# Patient Record
Sex: Female | Born: 1950 | Race: White | Hispanic: No | Marital: Married | Smoking: Former smoker
Health system: Southern US, Community
[De-identification: ages and names within clinical notes are randomized; demographics above are authoritative.]

## PROBLEM LIST (undated history)

## (undated) DIAGNOSIS — I10 Essential (primary) hypertension: Secondary | ICD-10-CM

## (undated) DIAGNOSIS — IMO0001 Reserved for inherently not codable concepts without codable children: Secondary | ICD-10-CM

## (undated) DIAGNOSIS — K5792 Diverticulitis of intestine, part unspecified, without perforation or abscess without bleeding: Secondary | ICD-10-CM

## (undated) DIAGNOSIS — M1990 Unspecified osteoarthritis, unspecified site: Secondary | ICD-10-CM

## (undated) DIAGNOSIS — T4145XA Adverse effect of unspecified anesthetic, initial encounter: Secondary | ICD-10-CM

## (undated) DIAGNOSIS — K219 Gastro-esophageal reflux disease without esophagitis: Secondary | ICD-10-CM

## (undated) DIAGNOSIS — T8859XA Other complications of anesthesia, initial encounter: Secondary | ICD-10-CM

## (undated) DIAGNOSIS — E785 Hyperlipidemia, unspecified: Secondary | ICD-10-CM

## (undated) DIAGNOSIS — H348192 Central retinal vein occlusion, unspecified eye, stable: Secondary | ICD-10-CM

## (undated) DIAGNOSIS — F419 Anxiety disorder, unspecified: Secondary | ICD-10-CM

## (undated) DIAGNOSIS — F909 Attention-deficit hyperactivity disorder, unspecified type: Secondary | ICD-10-CM

## (undated) HISTORY — PX: CARDIAC CATHETERIZATION: SHX172

## (undated) HISTORY — PX: ABDOMINAL HYSTERECTOMY: SHX81

## (undated) HISTORY — PX: APPENDECTOMY: SHX54

## (undated) HISTORY — PX: BREAST SURGERY: SHX581

## (undated) HISTORY — PX: CHOLECYSTECTOMY: SHX55

## (undated) HISTORY — PX: BACK SURGERY: SHX140

---

## 2006-02-01 ENCOUNTER — Emergency Department (HOSPITAL_COMMUNITY): Admission: EM | Admit: 2006-02-01 | Discharge: 2006-02-01 | Payer: Self-pay | Admitting: *Deleted

## 2006-02-02 ENCOUNTER — Ambulatory Visit (HOSPITAL_COMMUNITY): Admission: RE | Admit: 2006-02-02 | Discharge: 2006-02-02 | Payer: Self-pay | Admitting: *Deleted

## 2006-02-03 ENCOUNTER — Emergency Department (HOSPITAL_COMMUNITY): Admission: EM | Admit: 2006-02-03 | Discharge: 2006-02-03 | Payer: Self-pay | Admitting: Family Medicine

## 2011-03-31 ENCOUNTER — Encounter: Payer: Self-pay | Admitting: *Deleted

## 2011-03-31 ENCOUNTER — Emergency Department (INDEPENDENT_AMBULATORY_CARE_PROVIDER_SITE_OTHER): Payer: PRIVATE HEALTH INSURANCE

## 2011-03-31 ENCOUNTER — Emergency Department (HOSPITAL_BASED_OUTPATIENT_CLINIC_OR_DEPARTMENT_OTHER)
Admission: EM | Admit: 2011-03-31 | Discharge: 2011-03-31 | Disposition: A | Discharge status: Home / Self Care | Payer: PRIVATE HEALTH INSURANCE | Attending: Emergency Medicine | Admitting: Emergency Medicine

## 2011-03-31 DIAGNOSIS — M25569 Pain in unspecified knee: Secondary | ICD-10-CM

## 2011-03-31 DIAGNOSIS — Z8739 Personal history of other diseases of the musculoskeletal system and connective tissue: Secondary | ICD-10-CM | POA: Insufficient documentation

## 2011-03-31 DIAGNOSIS — W19XXXA Unspecified fall, initial encounter: Secondary | ICD-10-CM

## 2011-03-31 DIAGNOSIS — I10 Essential (primary) hypertension: Secondary | ICD-10-CM | POA: Insufficient documentation

## 2011-03-31 DIAGNOSIS — W010XXA Fall on same level from slipping, tripping and stumbling without subsequent striking against object, initial encounter: Secondary | ICD-10-CM | POA: Insufficient documentation

## 2011-03-31 DIAGNOSIS — S8390XA Sprain of unspecified site of unspecified knee, initial encounter: Secondary | ICD-10-CM

## 2011-03-31 DIAGNOSIS — S8000XA Contusion of unspecified knee, initial encounter: Secondary | ICD-10-CM | POA: Insufficient documentation

## 2011-03-31 DIAGNOSIS — IMO0002 Reserved for concepts with insufficient information to code with codable children: Secondary | ICD-10-CM | POA: Insufficient documentation

## 2011-03-31 HISTORY — DX: Unspecified osteoarthritis, unspecified site: M19.90

## 2011-03-31 HISTORY — DX: Anxiety disorder, unspecified: F41.9

## 2011-03-31 HISTORY — DX: Essential (primary) hypertension: I10

## 2011-03-31 HISTORY — DX: Attention-deficit hyperactivity disorder, unspecified type: F90.9

## 2011-03-31 MED ORDER — OXYCODONE-ACETAMINOPHEN 5-325 MG PO TABS: 2.0000 | ORAL_TABLET | ORAL | Status: AC | PRN

## 2011-03-31 NOTE — ED Provider Notes (Signed)
Medical screening examination/treatment/procedure(s) were performed by non-physician practitioner and as supervising physician I was immediately available for consultation/collaboration.   Aria Pickrell A. Patrica Duel, MD 03/31/11 1531

## 2011-03-31 NOTE — ED Notes (Signed)
Last week she slipped on a wet floor. Pain is not better. Bruising and swelling noted.

## 2011-03-31 NOTE — ED Provider Notes (Signed)
History     CSN: [id number] Arrival date & time: 03/31/2011  2:15 PM   Chief Complaint  Patient presents with  . Knee Injury     (Include location/radiation/quality/duration/timing/severity/associated sxs/prior treatment) HPI Comments: Pt states that she slipped on the floor and she thought it was getting better and until the swelling started again  Patient is a 60 y.o. female presenting with knee pain. The history is provided by the patient. No language interpreter was used.  Knee Pain This is a new problem. The current episode started in the past 7 days. The problem occurs intermittently. The problem has been gradually worsening. The symptoms are aggravated by bending. She has tried ice for the symptoms.     Past Medical History  Diagnosis Date  . Hypertension   . Arthritis   . Anxiety   . ADD (attention deficit disorder with hyperactivity)      Past Surgical History  Procedure Date  . Back surgery   . Abdominal hysterectomy   . Appendectomy   . Cholecystectomy     No family history on file.  History  Substance Use Topics  . Smoking status: Former Games developer  . Smokeless tobacco: Not on file  . Alcohol Use: Yes    OB History    Grav Para Term Preterm Abortions TAB SAB Ect Mult Living                  Review of Systems  Constitutional: Negative.   Respiratory: Negative.   Neurological: Negative.     Allergies  Darvocet; Morphine and related; and Percodan  Home Medications   Current Outpatient Rx  Name Route Sig Dispense Refill  . LIPITOR PO Oral Take by mouth.      Marland Kitchen LEXAPRO PO Oral Take by mouth.      Marland Kitchen FLUTICASONE PROPIONATE 50 MCG/ACT NA SUSP Nasal Place 2 sprays into the nose daily.      Marland Kitchen VYVANSE PO Oral Take by mouth.        Physical Exam    BP 149/82  Pulse 85  Temp(Src) 97.9 F (36.6 C) (Oral)  Resp 20  SpO2 100%  Physical Exam  Nursing note and vitals reviewed. Constitutional: She appears well-developed and well-nourished.    Cardiovascular: Normal rate and regular rhythm.   Pulmonary/Chest: Effort normal and breath sounds normal.  Musculoskeletal: Normal range of motion. She exhibits edema and tenderness.       Pt has generalized bruising and swelling noted tot he right knee  Neurological: She is alert.    ED Course  Procedures  No results found for this or any previous visit. Dg Knee Complete 4 Views Right  03/31/2011  *RADIOLOGY REPORT*  Clinical Data: Larey Seat.  Right knee pain.  RIGHT KNEE - COMPLETE 4+ VIEW  Comparison: None  Findings: The joint spaces are maintained.  Minimal degenerative changes.  No acute fracture or osteochondral lesion.  There is an old healed fibrous cortical lesion involving the tibial metaphysis. No joint effusion.  IMPRESSION: Minimal degenerative changes.  No acute bony findings or joint effusion.  Original Report Authenticated By: P. Loralie Champagne, M.D.      MDM No acute finding:pt is okay to follow up with ortho at home:pt put in immobilizer and crutches for comfort       Teressa Lower, NP 03/31/11 1521

## 2011-04-14 DIAGNOSIS — K5792 Diverticulitis of intestine, part unspecified, without perforation or abscess without bleeding: Secondary | ICD-10-CM

## 2011-04-14 HISTORY — DX: Diverticulitis of intestine, part unspecified, without perforation or abscess without bleeding: K57.92

## 2011-12-10 ENCOUNTER — Other Ambulatory Visit: Payer: Self-pay | Admitting: Orthopedic Surgery

## 2011-12-10 DIAGNOSIS — M25561 Pain in right knee: Secondary | ICD-10-CM

## 2011-12-13 ENCOUNTER — Ambulatory Visit
Admission: RE | Admit: 2011-12-13 | Discharge: 2011-12-13 | Disposition: A | Discharge status: Home / Self Care | Payer: PRIVATE HEALTH INSURANCE | Source: Ambulatory Visit | Attending: Orthopedic Surgery | Admitting: Orthopedic Surgery

## 2011-12-13 DIAGNOSIS — M25561 Pain in right knee: Secondary | ICD-10-CM

## 2011-12-14 ENCOUNTER — Other Ambulatory Visit: Payer: PRIVATE HEALTH INSURANCE

## 2011-12-30 ENCOUNTER — Encounter (HOSPITAL_COMMUNITY): Payer: Self-pay | Admitting: Pharmacy Technician

## 2012-01-01 ENCOUNTER — Encounter (HOSPITAL_COMMUNITY): Payer: Self-pay

## 2012-01-01 ENCOUNTER — Ambulatory Visit (HOSPITAL_COMMUNITY)
Admission: RE | Admit: 2012-01-01 | Discharge: 2012-01-01 | Disposition: A | Discharge status: Home / Self Care | Payer: 59 | Source: Ambulatory Visit | Attending: Orthopedic Surgery | Admitting: Orthopedic Surgery

## 2012-01-01 ENCOUNTER — Encounter (HOSPITAL_COMMUNITY)
Admission: RE | Admit: 2012-01-01 | Discharge: 2012-01-01 | Disposition: A | Discharge status: Home / Self Care | Payer: 59 | Source: Ambulatory Visit | Attending: Orthopedic Surgery | Admitting: Orthopedic Surgery

## 2012-01-01 DIAGNOSIS — Z01812 Encounter for preprocedural laboratory examination: Secondary | ICD-10-CM | POA: Insufficient documentation

## 2012-01-01 DIAGNOSIS — X58XXXA Exposure to other specified factors, initial encounter: Secondary | ICD-10-CM | POA: Insufficient documentation

## 2012-01-01 DIAGNOSIS — IMO0002 Reserved for concepts with insufficient information to code with codable children: Secondary | ICD-10-CM | POA: Insufficient documentation

## 2012-01-01 DIAGNOSIS — Z01818 Encounter for other preprocedural examination: Secondary | ICD-10-CM | POA: Insufficient documentation

## 2012-01-01 HISTORY — DX: Gastro-esophageal reflux disease without esophagitis: K21.9

## 2012-01-01 HISTORY — DX: Diverticulitis of intestine, part unspecified, without perforation or abscess without bleeding: K57.92

## 2012-01-01 LAB — ABO/RH: ABO/RH(D): O NEG

## 2012-01-01 LAB — BASIC METABOLIC PANEL
BUN: 19 mg/dL (ref 6–23)
CO2: 29 mEq/L (ref 19–32)
Chloride: 97 mEq/L (ref 96–112)
Creatinine, Ser: 0.65 mg/dL (ref 0.50–1.10)
Glucose, Bld: 83 mg/dL (ref 70–99)

## 2012-01-01 LAB — CBC
HCT: 40.8 % (ref 36.0–46.0)
MCH: 27.3 pg (ref 26.0–34.0)
MCV: 84.3 fL (ref 78.0–100.0)
RBC: 4.84 MIL/uL (ref 3.87–5.11)
WBC: 6.8 10*3/uL (ref 4.0–10.5)

## 2012-01-01 NOTE — Patient Instructions (Signed)
22 Bianca Ferguson  01/01/2012   Your procedure is scheduled on:  Wednesday 01/07/2012 at 400pm  Report to [facility] at 130 pm  Call this number if you have problems the morning of surgery: [phone]   Remember:   Do not eat food:After Midnight.  May have clear liquids:up to 6 Hours before arrival- MAY HAVE CLEAR LIQUIDS FROM MIDNIGHT UP UNTIL 10AM THEN NOTHING UNTIL AFTER SURGERY!  Clear liquids include soda, tea, black coffee, apple or grape juice, broth.  Take these medicines the morning of surgery with A SIP OF WATER: Protonix   Do not wear jewelry, make-up or nail polish.  Do not wear lotions, powders, or perfumes.   Do not shave 48 hours prior to surgery. Men may shave face and neck.  Do not bring valuables to the hospital.  Contacts, dentures or bridgework may not be worn into surgery.       Patients discharged the day of surgery will not be allowed to drive home.  Name and phone number of your driver: Sal Volker  Special Instructions: CHG Shower Use Special Wash: 1/2 bottle night before surgery and 1/2 bottle morning of surgery.   Please read over the following fact sheets that you were given: MRSA Information, Blood fact sheet, Incentive Spirometry sheet, Sleep apnea sheet                If you have any questions, please call me at [phone] [facility].Dowell Hoon,RN,BSN

## 2012-01-01 NOTE — Pre-Procedure Instructions (Signed)
From [facility] 06/20/2010,Stress test 12/09/2007, Last Office Note 12/08/2011on chart.

## 2012-01-01 NOTE — Pre-Procedure Instructions (Signed)
Reviewed with patient pre-op instructions using Teach back method. 

## 2012-01-06 ENCOUNTER — Other Ambulatory Visit: Payer: Self-pay | Admitting: Orthopedic Surgery

## 2012-01-06 NOTE — Progress Notes (Signed)
Preoperative surgical orders have been place into the Epic hospital system for [facility] on 01/06/2012, 11:09 PM  by Patrica Duel for surgery on 01/07/2012.  Preop Knee orders including IV Tylenol, and IV Decadron as long as there are no contraindications to the above medications.

## 2012-01-07 ENCOUNTER — Encounter (HOSPITAL_COMMUNITY): Payer: Self-pay | Admitting: Anesthesiology

## 2012-01-07 ENCOUNTER — Ambulatory Visit (HOSPITAL_COMMUNITY): Payer: PRIVATE HEALTH INSURANCE | Admitting: Anesthesiology

## 2012-01-07 ENCOUNTER — Encounter (HOSPITAL_COMMUNITY)
Admission: RE | Disposition: A | Discharge status: Home / Self Care | Payer: Self-pay | Source: Ambulatory Visit | Attending: Orthopedic Surgery

## 2012-01-07 ENCOUNTER — Encounter (HOSPITAL_COMMUNITY): Payer: Self-pay | Admitting: *Deleted

## 2012-01-07 ENCOUNTER — Ambulatory Visit (HOSPITAL_COMMUNITY)
Admission: RE | Admit: 2012-01-07 | Discharge: 2012-01-07 | Disposition: A | Discharge status: Home / Self Care | Payer: PRIVATE HEALTH INSURANCE | Source: Ambulatory Visit | Attending: Orthopedic Surgery | Admitting: Orthopedic Surgery

## 2012-01-07 DIAGNOSIS — S83249A Other tear of medial meniscus, current injury, unspecified knee, initial encounter: Secondary | ICD-10-CM | POA: Diagnosis present

## 2012-01-07 DIAGNOSIS — K219 Gastro-esophageal reflux disease without esophagitis: Secondary | ICD-10-CM | POA: Insufficient documentation

## 2012-01-07 DIAGNOSIS — X58XXXA Exposure to other specified factors, initial encounter: Secondary | ICD-10-CM | POA: Insufficient documentation

## 2012-01-07 DIAGNOSIS — Z79899 Other long term (current) drug therapy: Secondary | ICD-10-CM | POA: Insufficient documentation

## 2012-01-07 DIAGNOSIS — IMO0002 Reserved for concepts with insufficient information to code with codable children: Secondary | ICD-10-CM | POA: Insufficient documentation

## 2012-01-07 DIAGNOSIS — I10 Essential (primary) hypertension: Secondary | ICD-10-CM | POA: Insufficient documentation

## 2012-01-07 HISTORY — PX: KNEE ARTHROSCOPY: SHX127

## 2012-01-07 LAB — TYPE AND SCREEN
ABO/RH(D): O NEG
Antibody Screen: NEGATIVE

## 2012-01-07 SURGERY — ARTHROSCOPY, KNEE
Anesthesia: General | Site: Knee | Laterality: Right | Wound class: Clean

## 2012-01-07 MED ORDER — PROMETHAZINE HCL 25 MG/ML IJ SOLN: 6.2500 mg | INTRAMUSCULAR | Status: DC | PRN

## 2012-01-07 MED ORDER — DEXAMETHASONE SODIUM PHOSPHATE 10 MG/ML IJ SOLN
10.0000 mg | Freq: Once | INTRAMUSCULAR | Status: AC
  Administered 2012-01-07: 10 mg via INTRAVENOUS
  Filled 2012-01-07: qty 1

## 2012-01-07 MED ORDER — METHOCARBAMOL 500 MG PO TABS: 500.0000 mg | ORAL_TABLET | Freq: Four times a day (QID) | ORAL | Status: AC

## 2012-01-07 MED ORDER — FENTANYL CITRATE 0.05 MG/ML IJ SOLN
INTRAMUSCULAR | Status: AC
  Filled 2012-01-07: qty 2

## 2012-01-07 MED ORDER — HYDROMORPHONE HCL 2 MG PO TABS
ORAL_TABLET | ORAL | Status: AC
  Administered 2012-01-07: 2 mg via ORAL
  Filled 2012-01-07: qty 1

## 2012-01-07 MED ORDER — MEPERIDINE HCL 50 MG/ML IJ SOLN: 6.2500 mg | INTRAMUSCULAR | Status: DC | PRN

## 2012-01-07 MED ORDER — HYDROMORPHONE HCL 2 MG PO TABS: 2.0000 mg | ORAL_TABLET | ORAL | Status: AC | PRN

## 2012-01-07 MED ORDER — LACTATED RINGERS IV SOLN
INTRAVENOUS | Status: DC
  Administered 2012-01-07: 1000 mL via INTRAVENOUS
  Administered 2012-01-07: 16:00:00 via INTRAVENOUS

## 2012-01-07 MED ORDER — ACETAMINOPHEN 10 MG/ML IV SOLN
1000.0000 mg | Freq: Once | INTRAVENOUS | Status: DC
  Filled 2012-01-07: qty 100

## 2012-01-07 MED ORDER — CHLORHEXIDINE GLUCONATE 4 % EX LIQD
60.0000 mL | Freq: Once | CUTANEOUS | Status: DC
  Filled 2012-01-07: qty 60

## 2012-01-07 MED ORDER — BUPIVACAINE-EPINEPHRINE PF 0.25-1:200000 % IJ SOLN
INTRAMUSCULAR | Status: AC
  Filled 2012-01-07: qty 30

## 2012-01-07 MED ORDER — SODIUM CHLORIDE 0.9 % IV SOLN: INTRAVENOUS | Status: DC

## 2012-01-07 MED ORDER — ONDANSETRON HCL 4 MG/2ML IJ SOLN
INTRAMUSCULAR | Status: DC | PRN
  Administered 2012-01-07: 4 mg via INTRAVENOUS

## 2012-01-07 MED ORDER — DIPHENHYDRAMINE HCL 50 MG/ML IJ SOLN
INTRAMUSCULAR | Status: AC
  Filled 2012-01-07: qty 1

## 2012-01-07 MED ORDER — CEFAZOLIN SODIUM-DEXTROSE 2-3 GM-% IV SOLR: 2.0000 g | INTRAVENOUS | Status: DC

## 2012-01-07 MED ORDER — ACETAMINOPHEN 10 MG/ML IV SOLN
INTRAVENOUS | Status: DC | PRN
  Administered 2012-01-07: 1000 mg via INTRAVENOUS

## 2012-01-07 MED ORDER — BUPIVACAINE-EPINEPHRINE 0.25% -1:200000 IJ SOLN
INTRAMUSCULAR | Status: DC | PRN
  Administered 2012-01-07: 20 mL

## 2012-01-07 MED ORDER — FENTANYL CITRATE 0.05 MG/ML IJ SOLN
INTRAMUSCULAR | Status: DC | PRN
  Administered 2012-01-07 (×5): 50 ug via INTRAVENOUS

## 2012-01-07 MED ORDER — LIDOCAINE HCL (CARDIAC) 20 MG/ML IV SOLN
INTRAVENOUS | Status: DC | PRN
  Administered 2012-01-07: 100 mg via INTRAVENOUS

## 2012-01-07 MED ORDER — KETOROLAC TROMETHAMINE 30 MG/ML IJ SOLN
INTRAMUSCULAR | Status: DC | PRN
  Administered 2012-01-07: 30 mg via INTRAVENOUS

## 2012-01-07 MED ORDER — CEFAZOLIN SODIUM-DEXTROSE 2-3 GM-% IV SOLR
INTRAVENOUS | Status: AC
  Filled 2012-01-07: qty 50

## 2012-01-07 MED ORDER — FENTANYL CITRATE 0.05 MG/ML IJ SOLN
25.0000 ug | INTRAMUSCULAR | Status: DC | PRN
  Administered 2012-01-07 (×3): 50 ug via INTRAVENOUS

## 2012-01-07 MED ORDER — CEFAZOLIN SODIUM-DEXTROSE 2-3 GM-% IV SOLR
2.0000 g | INTRAVENOUS | Status: AC
  Administered 2012-01-07: 2 g via INTRAVENOUS

## 2012-01-07 MED ORDER — LACTATED RINGERS IV SOLN: INTRAVENOUS | Status: DC

## 2012-01-07 MED ORDER — DIPHENHYDRAMINE HCL 50 MG/ML IJ SOLN
12.5000 mg | Freq: Once | INTRAMUSCULAR | Status: AC
  Administered 2012-01-07: 12.5 mg via INTRAVENOUS

## 2012-01-07 MED ORDER — PROPOFOL 10 MG/ML IV EMUL
INTRAVENOUS | Status: DC | PRN
  Administered 2012-01-07: 200 mg via INTRAVENOUS

## 2012-01-07 MED ORDER — METHOCARBAMOL 500 MG PO TABS
ORAL_TABLET | ORAL | Status: AC
  Administered 2012-01-07: 500 mg via ORAL
  Filled 2012-01-07: qty 1

## 2012-01-07 MED ORDER — MIDAZOLAM HCL 5 MG/5ML IJ SOLN
INTRAMUSCULAR | Status: DC | PRN
  Administered 2012-01-07: 1 mg via INTRAVENOUS

## 2012-01-07 MED ORDER — ACETAMINOPHEN 10 MG/ML IV SOLN
INTRAVENOUS | Status: AC
  Filled 2012-01-07: qty 100

## 2012-01-07 MED ORDER — LACTATED RINGERS IR SOLN
Status: DC | PRN
  Administered 2012-01-07: 1

## 2012-01-07 SURGICAL SUPPLY — 23 items
BLADE 4.2CUDA (BLADE) ×2 IMPLANT
CLOTH BEACON ORANGE TIMEOUT ST (SAFETY) ×2 IMPLANT
CUFF TOURN SGL QUICK 34 (TOURNIQUET CUFF) ×1
CUFF TRNQT CYL 34X4X40X1 (TOURNIQUET CUFF) ×1 IMPLANT
DRAPE U-SHAPE 47X51 STRL (DRAPES) ×2 IMPLANT
DRSG EMULSION OIL 3X3 NADH (GAUZE/BANDAGES/DRESSINGS) ×2 IMPLANT
DRSG PAD ABDOMINAL 8X10 ST (GAUZE/BANDAGES/DRESSINGS) ×2 IMPLANT
DURAPREP 26ML APPLICATOR (WOUND CARE) ×2 IMPLANT
GLOVE BIO SURGEON STRL SZ7.5 (GLOVE) ×2 IMPLANT
GLOVE BIO SURGEON STRL SZ8 (GLOVE) ×2 IMPLANT
GLOVE BIOGEL PI IND STRL 8 (GLOVE) ×1 IMPLANT
GLOVE BIOGEL PI INDICATOR 8 (GLOVE) ×1
GOWN STRL NON-REIN LRG LVL3 (GOWN DISPOSABLE) ×2 IMPLANT
MANIFOLD NEPTUNE II (INSTRUMENTS) ×4 IMPLANT
PACK ARTHROSCOPY WL (CUSTOM PROCEDURE TRAY) ×2 IMPLANT
PADDING CAST COTTON 6X4 STRL (CAST SUPPLIES) ×2 IMPLANT
POSITIONER SURGICAL ARM (MISCELLANEOUS) ×2 IMPLANT
SET ARTHROSCOPY TUBING (MISCELLANEOUS) ×1
SET ARTHROSCOPY TUBING LN (MISCELLANEOUS) ×1 IMPLANT
SUT ETHILON 4 0 PS 2 18 (SUTURE) ×2 IMPLANT
TOWEL OR 17X26 10 PK STRL BLUE (TOWEL DISPOSABLE) ×2 IMPLANT
WAND 90 DEG TURBOVAC W/CORD (SURGICAL WAND) ×2 IMPLANT
WRAP KNEE MAXI GEL POST OP (GAUZE/BANDAGES/DRESSINGS) ×4 IMPLANT

## 2012-01-07 NOTE — Anesthesia Preprocedure Evaluation (Addendum)
Anesthesia Evaluation  Patient identified by MRN, date of birth, ID band Patient awake    Reviewed: Allergy & Precautions, H&P , NPO status , Patient's Chart, lab work & pertinent test results  Airway Mallampati: I TM Distance: >3 FB Neck ROM: Full    Dental No notable dental hx. (+) Teeth Intact   Pulmonary neg pulmonary ROS,  breath sounds clear to auscultation  Pulmonary exam normal       Cardiovascular hypertension, Pt. on medications negative cardio ROS  Rhythm:Regular Rate:Normal     Neuro/Psych negative neurological ROS  negative psych ROS   GI/Hepatic negative GI ROS, Neg liver ROS, GERD-  Medicated and Controlled,  Endo/Other  negative endocrine ROS  Renal/GU negative Renal ROS  negative genitourinary   Musculoskeletal negative musculoskeletal ROS (+)   Abdominal   Peds negative pediatric ROS (+)  Hematology negative hematology ROS (+)   Anesthesia Other Findings   Reproductive/Obstetrics negative OB ROS                          Anesthesia Physical Anesthesia Plan  ASA: II  Anesthesia Plan: General   Post-op Pain Management:    Induction: Intravenous  Airway Management Planned: LMA  Additional Equipment:   Intra-op Plan:   Post-operative Plan: Extubation in OR  Informed Consent: I have reviewed the patients History and Physical, chart, labs and discussed the procedure including the risks, benefits and alternatives for the proposed anesthesia with the patient or authorized representative who has indicated his/her understanding and acceptance.   Dental advisory given  Plan Discussed with: CRNA  Anesthesia Plan Comments:        Anesthesia Quick Evaluation

## 2012-01-07 NOTE — H&P (Signed)
  CC- Bianca Ferguson is a 61 y.o. female who presents with right knee pain.  HPI- . Knee Pain: Patient presents with knee pain involving the  right knee. Onset of the symptoms was several months ago. Inciting event: none known. Current symptoms include giving out, pain located medially, popping sensation and swelling. Pain is aggravated by going up and down stairs, kneeling, pivoting, rising after sitting, squatting and walking.  Patient has had no prior knee problems. Evaluation to date: MRI: abnormal medial meniscal tear. Treatment to date: OTC analgesics which are not very effective and rest.  Past Medical History  Diagnosis Date  . Hypertension   . Arthritis   . Anxiety   . ADD (attention deficit disorder with hyperactivity)   . GERD (gastroesophageal reflux disease)   . Diverticulitis 04/2011    Past Surgical History  Procedure Date  . Back surgery   . Abdominal hysterectomy   . Appendectomy   . Cholecystectomy   . 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 Authorizing Provider  Atorvastatin Calcium (LIPITOR PO) Take 80 mg by mouth at bedtime.     Historical Provider, MD  DULoxetine (CYMBALTA) 60 MG capsule Take 60 mg by mouth at bedtime.    Historical Provider, MD  ibuprofen (ADVIL,MOTRIN) 200 MG tablet Take 600 mg by mouth every 6 (six) hours as needed. For pain    Historical Provider, MD  Lisdexamfetamine Dimesylate (VYVANSE PO) Take 60 mg by mouth daily with breakfast.     Historical Provider, MD  pantoprazole (PROTONIX) 40 MG tablet Take 40 mg by mouth 2 (two) times daily.    Historical Provider, MD  traMADol (ULTRAM) 50 MG tablet Take 50-100 mg by mouth every 6 (six) hours as needed. For pain    Historical Provider, MD   KNEE EXAM soft tissue tenderness over medial joint line, negative drawer sign, collateral ligaments intact, normal ipsilateral  hip exam  Physical Examination: General appearance - alert, well appearing, and in no distress Mental status - alert, oriented to person, place, and time Chest - clear to auscultation, no wheezes, rales or rhonchi, symmetric air entry Heart - normal rate, regular rhythm, normal S1, S2, no murmurs, rubs, clicks or gallops Abdomen - soft, nontender, nondistended, no masses or organomegaly Neurological - alert, oriented, normal speech, no focal findings or movement disorder noted   Asessment/Plan--- Rightt knee medial meniscal tear- - Plan rightt knee arthroscopy with meniscal debridement. Procedure risks and potential comps discussed with patient who elects to proceed. Goals are decreased pain and increased function with a high likelihood of achieving both

## 2012-01-07 NOTE — Op Note (Signed)
Preoperative diagnosis-  Right knee medial meniscal tear  Postoperative diagnosis Right- knee medial meniscal tear   Plus Right medial femoral chondral defect  Procedure- Right knee arthroscopy with medial Meniscal debridement and chondroplasty   Surgeon- Gus Rankin. Jenisse Vullo, MD  Anesthesia-General  EBL-  minimal Complications- None  Condition- PACU - hemodynamically stable.  Brief clinical note- -Bianca Ferguson is a 61 y.o.  female with a several month history of right knee pain and mechanical symptoms. Exam and history suggested medial meniscal tear confirmed by MRI. The patient presents now for arthroscopy and debridement   Procedure in detail -       After successful administration of General anesthetic, a tourmiquet is placed high on the Right  thigh and the Right lower extremity is prepped and draped in the usual sterile fashion. Time out is performed by the surgical team. Standard superomedial and inferolateral portal sites are marked and incisions made with an 11 blade. The inflow cannula is passed through the superomedial portal and camera through the inferolateral portal and inflow is initiated. Arthroscopic visualization proceeds.      The undersurface of the patella and trochlea are visualized and they appear normal. The medial and lateral gutters are visualized and there are no loose bodies. Flexion and valgus force is applied to the knee and the medial compartment is entered. A spinal needle is passed into the joint through the site marked for the inferomedial portal. A small incision is made and the dilator passed into the joint. The findings for the medial compartment are degenerative tear body and posterior horn of the medial meniscus and an approximately 2 x 2 cm area of unstable cartilage on the medial femoral condyle . The tear is debrided to a stable base with baskets and a shaver and sealed off with the Arthrocare. The shaver is used to debride the unstable cartilage  to a stable bony base with stable edges. It is probed and found to be stable. The bone is abraded with the shaver in hopes of forming fibrocartilage over this area.    The intercondylar notch is visualized and the ACL appears normal. The lateral compartment is entered and the findings are normal .      The joint is again inspected and there are no other tears, defects or loose bodies identified. The arthroscopic equipment is then removed from the inferior portals which are closed with interrupted 4-0 nylon. 20 ml of .25% Marcaine with epinephrine are injected through the inflow cannula and the cannula is then removed and the portal closed with nylon. The incisions are cleaned and dried and a bulky sterile dressing is applied. The patient is then awakened and transported to recovery in stable condition.   01/07/2012, 4:19 PM

## 2012-01-07 NOTE — Preoperative (Signed)
Beta Blockers   Reason not to administer Beta Blockers:Not Applicable 

## 2012-01-07 NOTE — Anesthesia Postprocedure Evaluation (Signed)
  Anesthesia Post-op Note  Patient: Bianca Ferguson Reason  Procedure(s) Performed: Procedure(s) (LRB): ARTHROSCOPY KNEE (Right)  Patient Location: PACU  Anesthesia Type: General  Level of Consciousness: oriented and sedated  Airway and Oxygen Therapy: Patient Spontanous Breathing  Post-op Pain: mild  Post-op Assessment: Post-op Vital signs reviewed, Patient's Cardiovascular Status Stable, Respiratory Function Stable and Patent Airway  Post-op Vital Signs: stable  Complications: No apparent anesthesia complications

## 2012-01-07 NOTE — Interval H&P Note (Signed)
History and Physical Interval Note:  01/07/2012 3:01 PM  Bianca Ferguson  has presented today for surgery, with the diagnosis of right knee medial meniscal tear  The various methods of treatment have been discussed with the patient and family. After consideration of risks, benefits and other options for treatment, the patient has consented to  Procedure(s) (LRB): ARTHROSCOPY KNEE (Right) as a surgical intervention .  The patient's history has been reviewed, patient examined, no change in status, stable for surgery.  I have reviewed the patients' chart and labs.  Questions were answered to the patient's satisfaction.     [name]

## 2012-01-07 NOTE — Transfer of Care (Signed)
Immediate Anesthesia Transfer of Care Note  Patient: Bianca Ferguson  Procedure(s) Performed: Procedure(s) (LRB): ARTHROSCOPY KNEE (Right)  Patient Location: PACU  Anesthesia Type: General  Level of Consciousness: awake and alert   Airway & Oxygen Therapy: Patient Spontanous Breathing and Patient connected to face mask oxygen  Post-op Assessment: Report given to PACU RN and Post -op Vital signs reviewed and stable  Post vital signs: Reviewed and stable  Complications: No apparent anesthesia complications

## 2012-01-08 NOTE — OR Nursing (Signed)
Corrected procedure time, S Programmer, multimedia, AD

## 2012-01-09 ENCOUNTER — Encounter (HOSPITAL_COMMUNITY): Payer: Self-pay | Admitting: Orthopedic Surgery

## 2012-08-03 ENCOUNTER — Other Ambulatory Visit: Payer: Self-pay | Admitting: Orthopedic Surgery

## 2012-08-03 MED ORDER — DEXAMETHASONE SODIUM PHOSPHATE 10 MG/ML IJ SOLN: 10.0000 mg | Freq: Once | INTRAMUSCULAR | Status: DC

## 2012-08-03 NOTE — Progress Notes (Signed)
Preoperative surgical orders have been place into the Epic hospital system for [facility] on 08/03/2012, 5:41 PM  by Patrica Duel for surgery on 09/08/2012.  Preop Knee Scope orders including IV Tylenol and IV Decadron as long as there are no contraindications to the above medications. Avel Peace, PA-C

## 2012-09-03 ENCOUNTER — Encounter (HOSPITAL_BASED_OUTPATIENT_CLINIC_OR_DEPARTMENT_OTHER): Payer: Self-pay | Admitting: *Deleted

## 2012-09-03 NOTE — Progress Notes (Signed)
Pt instructed npo p mn 2/25 x cymbalta, protonix w sip of water.  To [facility] 2.26 @ 0715.  Needs hgb on arrival.  ekg in epic.  Pt aware to do hibiclens shower hs.  Pt encouraged to avoid etoh, asa and nsaids until after her appointment w Dr. Ashley Royalty on Monday .  Pt verbalized her understanding.

## 2012-09-06 ENCOUNTER — Encounter (INDEPENDENT_AMBULATORY_CARE_PROVIDER_SITE_OTHER): Payer: PRIVATE HEALTH INSURANCE | Admitting: Ophthalmology

## 2012-09-06 DIAGNOSIS — H2510 Age-related nuclear cataract, unspecified eye: Secondary | ICD-10-CM

## 2012-09-06 DIAGNOSIS — H348192 Central retinal vein occlusion, unspecified eye, stable: Secondary | ICD-10-CM

## 2012-09-06 DIAGNOSIS — H35039 Hypertensive retinopathy, unspecified eye: Secondary | ICD-10-CM

## 2012-09-06 DIAGNOSIS — H43819 Vitreous degeneration, unspecified eye: Secondary | ICD-10-CM

## 2012-09-07 NOTE — H&P (Signed)
  CC- Bianca Ferguson is a 62 y.o. female who presents with right knee pain.  HPI- . Knee Pain: Patient presents with knee pain involving the  right knee. Onset of the symptoms was several months ago. Inciting event: none known. Current symptoms include giving out, pain located posterior and medial and stiffness. Pain is aggravated by going up and down stairs, pivoting, rising after sitting and standing.  Patient has had prior knee problems. Evaluation to date: MRI: abnormal hypertrophic synovitis posterior to ACL/PCL and possible recurrent medial tear. Treatment to date: corticosteroid injection which was not very effective and rest.  Past Medical History  Diagnosis Date  . Arthritis   . Anxiety   . ADD (attention deficit disorder with hyperactivity)   . GERD (gastroesophageal reflux disease)   . Diverticulitis 04/2011  . Hypertension     no longer treated bp wnl x 1 year  . Hyperlipidemia   . Retinal vein occlusion     has appointment w Dr. Ashley Royalty 2/24    Past Surgical History  Procedure Laterality Date  . Back surgery    . Abdominal hysterectomy    . Appendectomy    . Cholecystectomy    . 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 Knee arthroscopy  01/07/2012    Procedure: ARTHROSCOPY KNEE;  Surgeon: [name], MD;  Location: [facility]S;  Service: Orthopedics;  Laterality: Right;  medial menical debridement, chondroplasty    Prior to Admission medications   Medication Sig Start Date End Date Taking? Authorizing Provider  HYDROcodone-acetaminophen (NORCO/VICODIN) 5-325 MG per tablet Take 1 tablet by mouth every 6 (six) hours as needed for pain.   Yes Historical Provider, MD  rosuvastatin (CRESTOR) 5 MG tablet Take 5 mg by mouth daily.   Yes Historical Provider, MD  diphenhydrAMINE (BENADRYL) 25 MG tablet Take 50 mg by mouth every 6 (six) hours as needed.    Historical Provider, MD  DULoxetine (CYMBALTA) 60 MG capsule Take 60  mg by mouth at bedtime.    Historical Provider, MD  ibuprofen (ADVIL,MOTRIN) 200 MG tablet Take 600 mg by mouth every 6 (six) hours as needed. For pain    Historical Provider, MD  Lisdexamfetamine Dimesylate (VYVANSE PO) Take 60 mg by mouth daily with breakfast.     Historical Provider, MD  pantoprazole (PROTONIX) 40 MG tablet Take 20 mg by mouth 2 (two) times daily.     Historical Provider, MD   KNEE PAIN antalgic gait, soft tissue tenderness over medial joint line, reduced range of motion, collateral ligaments intact, normal ipsilateral hip exam  Physical Examination: General appearance - alert, well appearing, and in no distress Mental status - alert, oriented to person, place, and time Chest - clear to auscultation, no wheezes, rales or rhonchi, symmetric air entry Heart - normal rate, regular rhythm, normal S1, S2, no murmurs, rubs, clicks or gallops Abdomen - soft, nontender, nondistended, no masses or organomegaly Neurological - alert, oriented, normal speech, no focal findings or movement disorder noted   Asessment/Plan--- Right knee medial meniscal tear and synovitis- - Plan right knee arthroscopy with meniscal debridement. Procedure risks and potential comps discussed with patient who elects to proceed. Goals are decreased pain and increased function with a high likelihood of achieving both

## 2012-09-08 ENCOUNTER — Encounter (HOSPITAL_BASED_OUTPATIENT_CLINIC_OR_DEPARTMENT_OTHER)
Admission: RE | Disposition: A | Discharge status: Home / Self Care | Payer: Self-pay | Source: Ambulatory Visit | Attending: Orthopedic Surgery

## 2012-09-08 ENCOUNTER — Ambulatory Visit (HOSPITAL_BASED_OUTPATIENT_CLINIC_OR_DEPARTMENT_OTHER)
Admission: RE | Admit: 2012-09-08 | Discharge: 2012-09-08 | Disposition: A | Discharge status: Home / Self Care | Payer: PRIVATE HEALTH INSURANCE | Source: Ambulatory Visit | Attending: Orthopedic Surgery | Admitting: Orthopedic Surgery

## 2012-09-08 ENCOUNTER — Encounter (HOSPITAL_BASED_OUTPATIENT_CLINIC_OR_DEPARTMENT_OTHER): Payer: Self-pay | Admitting: *Deleted

## 2012-09-08 ENCOUNTER — Encounter (HOSPITAL_BASED_OUTPATIENT_CLINIC_OR_DEPARTMENT_OTHER): Payer: Self-pay | Admitting: Anesthesiology

## 2012-09-08 ENCOUNTER — Ambulatory Visit (HOSPITAL_BASED_OUTPATIENT_CLINIC_OR_DEPARTMENT_OTHER): Payer: PRIVATE HEALTH INSURANCE | Admitting: Anesthesiology

## 2012-09-08 DIAGNOSIS — M9420 Chondromalacia, unspecified site: Secondary | ICD-10-CM | POA: Insufficient documentation

## 2012-09-08 DIAGNOSIS — I739 Peripheral vascular disease, unspecified: Secondary | ICD-10-CM | POA: Insufficient documentation

## 2012-09-08 DIAGNOSIS — E785 Hyperlipidemia, unspecified: Secondary | ICD-10-CM | POA: Insufficient documentation

## 2012-09-08 DIAGNOSIS — S83241D Other tear of medial meniscus, current injury, right knee, subsequent encounter: Secondary | ICD-10-CM

## 2012-09-08 DIAGNOSIS — S83249A Other tear of medial meniscus, current injury, unspecified knee, initial encounter: Secondary | ICD-10-CM

## 2012-09-08 DIAGNOSIS — M23329 Other meniscus derangements, posterior horn of medial meniscus, unspecified knee: Secondary | ICD-10-CM | POA: Insufficient documentation

## 2012-09-08 DIAGNOSIS — Z79899 Other long term (current) drug therapy: Secondary | ICD-10-CM | POA: Insufficient documentation

## 2012-09-08 DIAGNOSIS — K219 Gastro-esophageal reflux disease without esophagitis: Secondary | ICD-10-CM | POA: Insufficient documentation

## 2012-09-08 HISTORY — DX: Hyperlipidemia, unspecified: E78.5

## 2012-09-08 HISTORY — DX: Central retinal vein occlusion, unspecified eye, stable: H34.8192

## 2012-09-08 HISTORY — PX: KNEE ARTHROSCOPY: SHX127

## 2012-09-08 LAB — POCT HEMOGLOBIN-HEMACUE: Hemoglobin: 12.9 g/dL (ref 12.0–15.0)

## 2012-09-08 SURGERY — ARTHROSCOPY, KNEE
Anesthesia: General | Site: Knee | Laterality: Right

## 2012-09-08 MED ORDER — DIPHENHYDRAMINE HCL 50 MG/ML IJ SOLN
12.5000 mg | Freq: Four times a day (QID) | INTRAMUSCULAR | Status: AC | PRN
  Administered 2012-09-08: 12.5 mg via INTRAVENOUS
  Filled 2012-09-08: qty 0.25

## 2012-09-08 MED ORDER — HYDROMORPHONE HCL 2 MG PO TABS: 2.0000 mg | ORAL_TABLET | ORAL | Status: DC | PRN

## 2012-09-08 MED ORDER — LACTATED RINGERS IV SOLN
INTRAVENOUS | Status: DC | PRN
  Administered 2012-09-08 (×2): via INTRAVENOUS

## 2012-09-08 MED ORDER — LACTATED RINGERS IV SOLN: INTRAVENOUS | Status: DC | PRN

## 2012-09-08 MED ORDER — DEXTROSE 5 % IV SOLN
3.0000 g | INTRAVENOUS | Status: DC
  Filled 2012-09-08: qty 3000

## 2012-09-08 MED ORDER — PROMETHAZINE HCL 25 MG/ML IJ SOLN
6.2500 mg | INTRAMUSCULAR | Status: DC | PRN
  Filled 2012-09-08: qty 1

## 2012-09-08 MED ORDER — DEXAMETHASONE SODIUM PHOSPHATE 4 MG/ML IJ SOLN
INTRAMUSCULAR | Status: DC | PRN
  Administered 2012-09-08: 10 mg via INTRAVENOUS

## 2012-09-08 MED ORDER — SODIUM CHLORIDE 0.9 % IR SOLN
Status: DC | PRN
  Administered 2012-09-08: 9000 mL

## 2012-09-08 MED ORDER — METHOCARBAMOL 500 MG PO TABS
500.0000 mg | ORAL_TABLET | Freq: Once | ORAL | Status: AC
  Administered 2012-09-08: 500 mg via ORAL
  Filled 2012-09-08: qty 1

## 2012-09-08 MED ORDER — MIDAZOLAM HCL 5 MG/5ML IJ SOLN
INTRAMUSCULAR | Status: DC | PRN
  Administered 2012-09-08: 2 mg via INTRAVENOUS

## 2012-09-08 MED ORDER — FENTANYL CITRATE 0.05 MG/ML IJ SOLN
INTRAMUSCULAR | Status: DC | PRN
  Administered 2012-09-08: 25 ug via INTRAVENOUS
  Administered 2012-09-08: 50 ug via INTRAVENOUS
  Administered 2012-09-08 (×3): 25 ug via INTRAVENOUS
  Administered 2012-09-08: 50 ug via INTRAVENOUS

## 2012-09-08 MED ORDER — FENTANYL CITRATE 0.05 MG/ML IJ SOLN
25.0000 ug | INTRAMUSCULAR | Status: DC | PRN
  Administered 2012-09-08 (×2): 25 ug via INTRAVENOUS
  Administered 2012-09-08: 50 ug via INTRAVENOUS
  Filled 2012-09-08: qty 1

## 2012-09-08 MED ORDER — KETOROLAC TROMETHAMINE 30 MG/ML IJ SOLN
INTRAMUSCULAR | Status: DC | PRN
  Administered 2012-09-08: 30 mg via INTRAVENOUS

## 2012-09-08 MED ORDER — ONDANSETRON HCL 4 MG/2ML IJ SOLN
INTRAMUSCULAR | Status: DC | PRN
  Administered 2012-09-08: 4 mg via INTRAVENOUS

## 2012-09-08 MED ORDER — METHOCARBAMOL 500 MG PO TABS: 500.0000 mg | ORAL_TABLET | Freq: Four times a day (QID) | ORAL | Status: DC

## 2012-09-08 MED ORDER — CHLORHEXIDINE GLUCONATE 4 % EX LIQD
60.0000 mL | Freq: Once | CUTANEOUS | Status: DC
  Filled 2012-09-08: qty 60

## 2012-09-08 MED ORDER — ACETAMINOPHEN 10 MG/ML IV SOLN
1000.0000 mg | Freq: Once | INTRAVENOUS | Status: AC
  Administered 2012-09-08: 1000 mg via INTRAVENOUS
  Filled 2012-09-08: qty 100

## 2012-09-08 MED ORDER — LACTATED RINGERS IV SOLN
INTRAVENOUS | Status: DC
  Administered 2012-09-08: 08:00:00 via INTRAVENOUS
  Filled 2012-09-08: qty 1000

## 2012-09-08 MED ORDER — LIDOCAINE HCL (CARDIAC) 20 MG/ML IV SOLN
INTRAVENOUS | Status: DC | PRN
  Administered 2012-09-08: 100 mg via INTRAVENOUS

## 2012-09-08 MED ORDER — CEFAZOLIN SODIUM-DEXTROSE 2-3 GM-% IV SOLR
INTRAVENOUS | Status: DC | PRN
  Administered 2012-09-08: 2 g via INTRAVENOUS

## 2012-09-08 MED ORDER — HYDROMORPHONE HCL 2 MG PO TABS
2.0000 mg | ORAL_TABLET | ORAL | Status: DC | PRN
  Administered 2012-09-08: 2 mg via ORAL
  Filled 2012-09-08: qty 1

## 2012-09-08 MED ORDER — BUPIVACAINE HCL 0.25 % IJ SOLN
INTRAMUSCULAR | Status: DC | PRN
  Administered 2012-09-08: 20 mL

## 2012-09-08 MED ORDER — PROPOFOL 10 MG/ML IV BOLUS
INTRAVENOUS | Status: DC | PRN
  Administered 2012-09-08: 100 mg via INTRAVENOUS
  Administered 2012-09-08: 250 mg via INTRAVENOUS

## 2012-09-08 SURGICAL SUPPLY — 36 items
BANDAGE ELASTIC 6 VELCRO ST LF (GAUZE/BANDAGES/DRESSINGS) ×2 IMPLANT
BLADE 4.2CUDA (BLADE) ×2 IMPLANT
BLADE CUDA SHAVER 3.5 (BLADE) IMPLANT
BLADE CUTTER GATOR 3.5 (BLADE) IMPLANT
CANISTER SUCT LVC 12 LTR MEDI- (MISCELLANEOUS) ×2 IMPLANT
CANISTER SUCTION 2500CC (MISCELLANEOUS) IMPLANT
CLOTH BEACON ORANGE TIMEOUT ST (SAFETY) ×2 IMPLANT
DRAPE ARTHROSCOPY W/POUCH 114 (DRAPES) ×2 IMPLANT
DRSG EMULSION OIL 3X3 NADH (GAUZE/BANDAGES/DRESSINGS) ×2 IMPLANT
DRSG PAD ABDOMINAL 8X10 ST (GAUZE/BANDAGES/DRESSINGS) ×2 IMPLANT
DURAPREP 26ML APPLICATOR (WOUND CARE) ×2 IMPLANT
ELECT MENISCUS 165MM 90D (ELECTRODE) IMPLANT
ELECT REM PT RETURN 9FT ADLT (ELECTROSURGICAL)
ELECTRODE REM PT RTRN 9FT ADLT (ELECTROSURGICAL) IMPLANT
GAUZE SPONGE 4X4 12PLY STRL LF (GAUZE/BANDAGES/DRESSINGS) ×2 IMPLANT
GLOVE BIO SURGEON STRL SZ 6 (GLOVE) ×2 IMPLANT
GLOVE BIO SURGEON STRL SZ 6.5 (GLOVE) ×2 IMPLANT
GLOVE BIO SURGEON STRL SZ8 (GLOVE) ×2 IMPLANT
GLOVE INDICATOR 8.0 STRL GRN (GLOVE) ×2 IMPLANT
GOWN PREVENTION PLUS LG XLONG (DISPOSABLE) ×4 IMPLANT
IV NS IRRIG 3000ML ARTHROMATIC (IV SOLUTION) ×2 IMPLANT
KNEE WRAP E Z 3 GEL PACK (MISCELLANEOUS) ×2 IMPLANT
PACK ARTHROSCOPY DSU (CUSTOM PROCEDURE TRAY) ×2 IMPLANT
PACK BASIN DAY SURGERY FS (CUSTOM PROCEDURE TRAY) ×2 IMPLANT
PADDING CAST ABS 4INX4YD NS (CAST SUPPLIES) ×1
PADDING CAST ABS COTTON 4X4 ST (CAST SUPPLIES) ×1 IMPLANT
PADDING CAST COTTON 6X4 STRL (CAST SUPPLIES) ×2 IMPLANT
PENCIL BUTTON HOLSTER BLD 10FT (ELECTRODE) IMPLANT
SET ARTHROSCOPY TUBING (MISCELLANEOUS) ×1
SET ARTHROSCOPY TUBING LN (MISCELLANEOUS) ×1 IMPLANT
SPONGE GAUZE 4X4 12PLY (GAUZE/BANDAGES/DRESSINGS) ×2 IMPLANT
SUT ETHILON 4 0 PS 2 18 (SUTURE) ×2 IMPLANT
TOWEL OR 17X24 6PK STRL BLUE (TOWEL DISPOSABLE) ×2 IMPLANT
WAND 30 DEG SABER W/CORD (SURGICAL WAND) IMPLANT
WAND 90 DEG TURBOVAC W/CORD (SURGICAL WAND) ×2 IMPLANT
WATER STERILE IRR 500ML POUR (IV SOLUTION) ×2 IMPLANT

## 2012-09-08 NOTE — Anesthesia Postprocedure Evaluation (Signed)
  Anesthesia Post-op Note  Patient: Bianca Ferguson  Procedure(s) Performed: Procedure(s) (LRB): ARTHROSCOPY KNEE (Right)  Patient Location: PACU  Anesthesia Type: General  Level of Consciousness: awake and alert   Airway and Oxygen Therapy: Patient Spontanous Breathing  Post-op Pain: mild  Post-op Assessment: Post-op Vital signs reviewed, Patient's Cardiovascular Status Stable, Respiratory Function Stable, Patent Airway and No signs of Nausea or vomiting  Last Vitals:  Filed Vitals:   09/08/12 1030  BP: 147/77  Pulse:   Temp: 36.6 C  Resp:     Post-op Vital Signs: stable   Complications: No apparent anesthesia complications

## 2012-09-08 NOTE — Transfer of Care (Signed)
Immediate Anesthesia Transfer of Care Note  Patient: Bianca Ferguson  Procedure(s) Performed: Procedure(s) (LRB): ARTHROSCOPY KNEE (Right)  Patient Location: PACU  Anesthesia Type: General  Level of Consciousness: awake, sedated, patient cooperative and responds to stimulation  Airway & Oxygen Therapy: Patient Spontanous Breathing and Patient connected to face mask oxygen  Post-op Assessment: Report given to PACU RN, Post -op Vital signs reviewed and stable and Patient moving all extremities  Post vital signs: Reviewed and stable  Complications: No apparent anesthesia complications

## 2012-09-08 NOTE — Anesthesia Procedure Notes (Signed)
Procedure Name: LMA Insertion Date/Time: 09/08/2012 9:42 AM Performed by: Jessica Priest Pre-anesthesia Checklist: Patient identified, Emergency Drugs available, Suction available and Patient being monitored Patient Re-evaluated:Patient Re-evaluated prior to inductionOxygen Delivery Method: Circle System Utilized Preoxygenation: Pre-oxygenation with 100% oxygen Intubation Type: IV induction Ventilation: Mask ventilation without difficulty LMA: LMA inserted LMA Size: 4.0 Number of attempts: 1 Airway Equipment and Method: bite block Placement Confirmation: positive ETCO2 Tube secured with: Tape Dental Injury: Teeth and Oropharynx as per pre-operative assessment

## 2012-09-08 NOTE — Op Note (Signed)
Preoperative diagnosis-  Right knee medial meniscal tear  Postoperative diagnosis Right- knee medial meniscal tear   Plus Right knee ACL/PCL cyst  Procedure- Right knee arthroscopy with medial Meniscal debridement and debridement of cyst   Surgeon- Gus Rankin. Selena Swaminathan, MD  Anesthesia-General  EBL-  minimal Complications- None  Condition- PACU - hemodynamically stable.  Brief clinical note- -Bianca Ferguson is a 62 y.o.  female with a several month history of right knee pain and mechanical symptoms. Exam and history suggested recurrent medial meniscal tear confirmed by MRI. She also has an intercondylar cyst off the ACL/PCL.The patient presents now for arthroscopy and debridement   Procedure in detail -       After successful administration of General anesthetic, a tourmiquet is placed high on the Right  thigh and the Right lower extremity is prepped and draped in the usual sterile fashion. Time out is performed by the surgical team. Standard superomedial and inferolateral portal sites are marked and incisions made with an 11 blade. The inflow cannula is passed through the superomedial portal and camera through the inferolateral portal and inflow is initiated. Arthroscopic visualization proceeds.      The undersurface of the patella and trochlea are visualized and there is mild chondromalacia but no unstable cartilage lesion. The medial and lateral gutters are visualized and there are  no loose bodies. Flexion and valgus force is applied to the knee and the medial compartment is entered. A spinal needle is passed into the joint through the site marked for the inferomedial portal. A small incision is made and the dilator passed into the joint. The findings for the medial compartment are grade II chondromalacia medial femoral condyle and recurrent unstable tear of body and posterior horn of the medial meniscus. The tear is debrided to a stable base with baskets and a shaver and sealed off with  the Arthrocare.     The intercondylar notch is visualized and the ACL appears attenuated. I examined the intercondylar notch between the ACL and PCL and also posterior to the PCL and found a cyst between the ligaments. This is debrided to normal tissue with the Arthrocare The lateral compartment is entered and the findings are normal .      The joint is again inspected and there are no other tears, defects or loose bodies identified. The arthroscopic equipment is then removed from the inferior portals which are closed with interrupted 4-0 nylon. 20 ml of .25% Marcaine with epinephrine are injected through the inflow cannula and the cannula is then removed and the portal closed with nylon. The incisions are cleaned and dried and a bulky sterile dressing is applied. The patient is then awakened and transported to recovery in stable condition.   09/08/2012, 10:16 AM

## 2012-09-08 NOTE — Anesthesia Preprocedure Evaluation (Signed)
Anesthesia Evaluation  Patient identified by MRN, date of birth, ID band Patient awake    Reviewed: Allergy & Precautions, H&P , NPO status , Patient's Chart, lab work & pertinent test results  Airway Mallampati: II TM Distance: >3 FB Neck ROM: Full    Dental no notable dental hx.    Pulmonary neg pulmonary ROS,  breath sounds clear to auscultation  Pulmonary exam normal       Cardiovascular hypertension, + Peripheral Vascular Disease Rhythm:Regular Rate:Normal     Neuro/Psych negative neurological ROS  negative psych ROS   GI/Hepatic Neg liver ROS, GERD-  Medicated,  Endo/Other  negative endocrine ROS  Renal/GU negative Renal ROS  negative genitourinary   Musculoskeletal negative musculoskeletal ROS (+)   Abdominal   Peds negative pediatric ROS (+)  Hematology negative hematology ROS (+)   Anesthesia Other Findings   Reproductive/Obstetrics negative OB ROS                           Anesthesia Physical Anesthesia Plan  ASA: II  Anesthesia Plan: General   Post-op Pain Management:    Induction: Intravenous  Airway Management Planned: LMA  Additional Equipment:   Intra-op Plan:   Post-operative Plan:   Informed Consent: I have reviewed the patients History and Physical, chart, labs and discussed the procedure including the risks, benefits and alternatives for the proposed anesthesia with the patient or authorized representative who has indicated his/her understanding and acceptance.   Dental advisory given  Plan Discussed with: CRNA and Surgeon  Anesthesia Plan Comments:         Anesthesia Quick Evaluation

## 2012-09-08 NOTE — Interval H&P Note (Signed)
History and Physical Interval Note:  09/08/2012 9:33 AM  Bianca Ferguson  has presented today for surgery, with the diagnosis of RIGHT KNEE INTRA-ARTICULAR GANGLION   The various methods of treatment have been discussed with the patient and family. After consideration of risks, benefits and other options for treatment, the patient has consented to  Procedure(s) with comments: ARTHROSCOPY KNEE (Right) - WITH DEBRIDEMENT  as a surgical intervention .  The patient's history has been reviewed, patient examined, no change in status, stable for surgery.  I have reviewed the patient's chart and labs.  Questions were answered to the patient's satisfaction.     [name]

## 2012-09-09 ENCOUNTER — Encounter (HOSPITAL_BASED_OUTPATIENT_CLINIC_OR_DEPARTMENT_OTHER): Payer: Self-pay | Admitting: Orthopedic Surgery

## 2012-09-28 IMAGING — CR DG CHEST 2V
2 series · 2 of 2 positions shown · non-contrast
Comparison: None.

CLINICAL DATA: Preoperative radiograph

CHEST - 2 VIEW

[w chest pa]
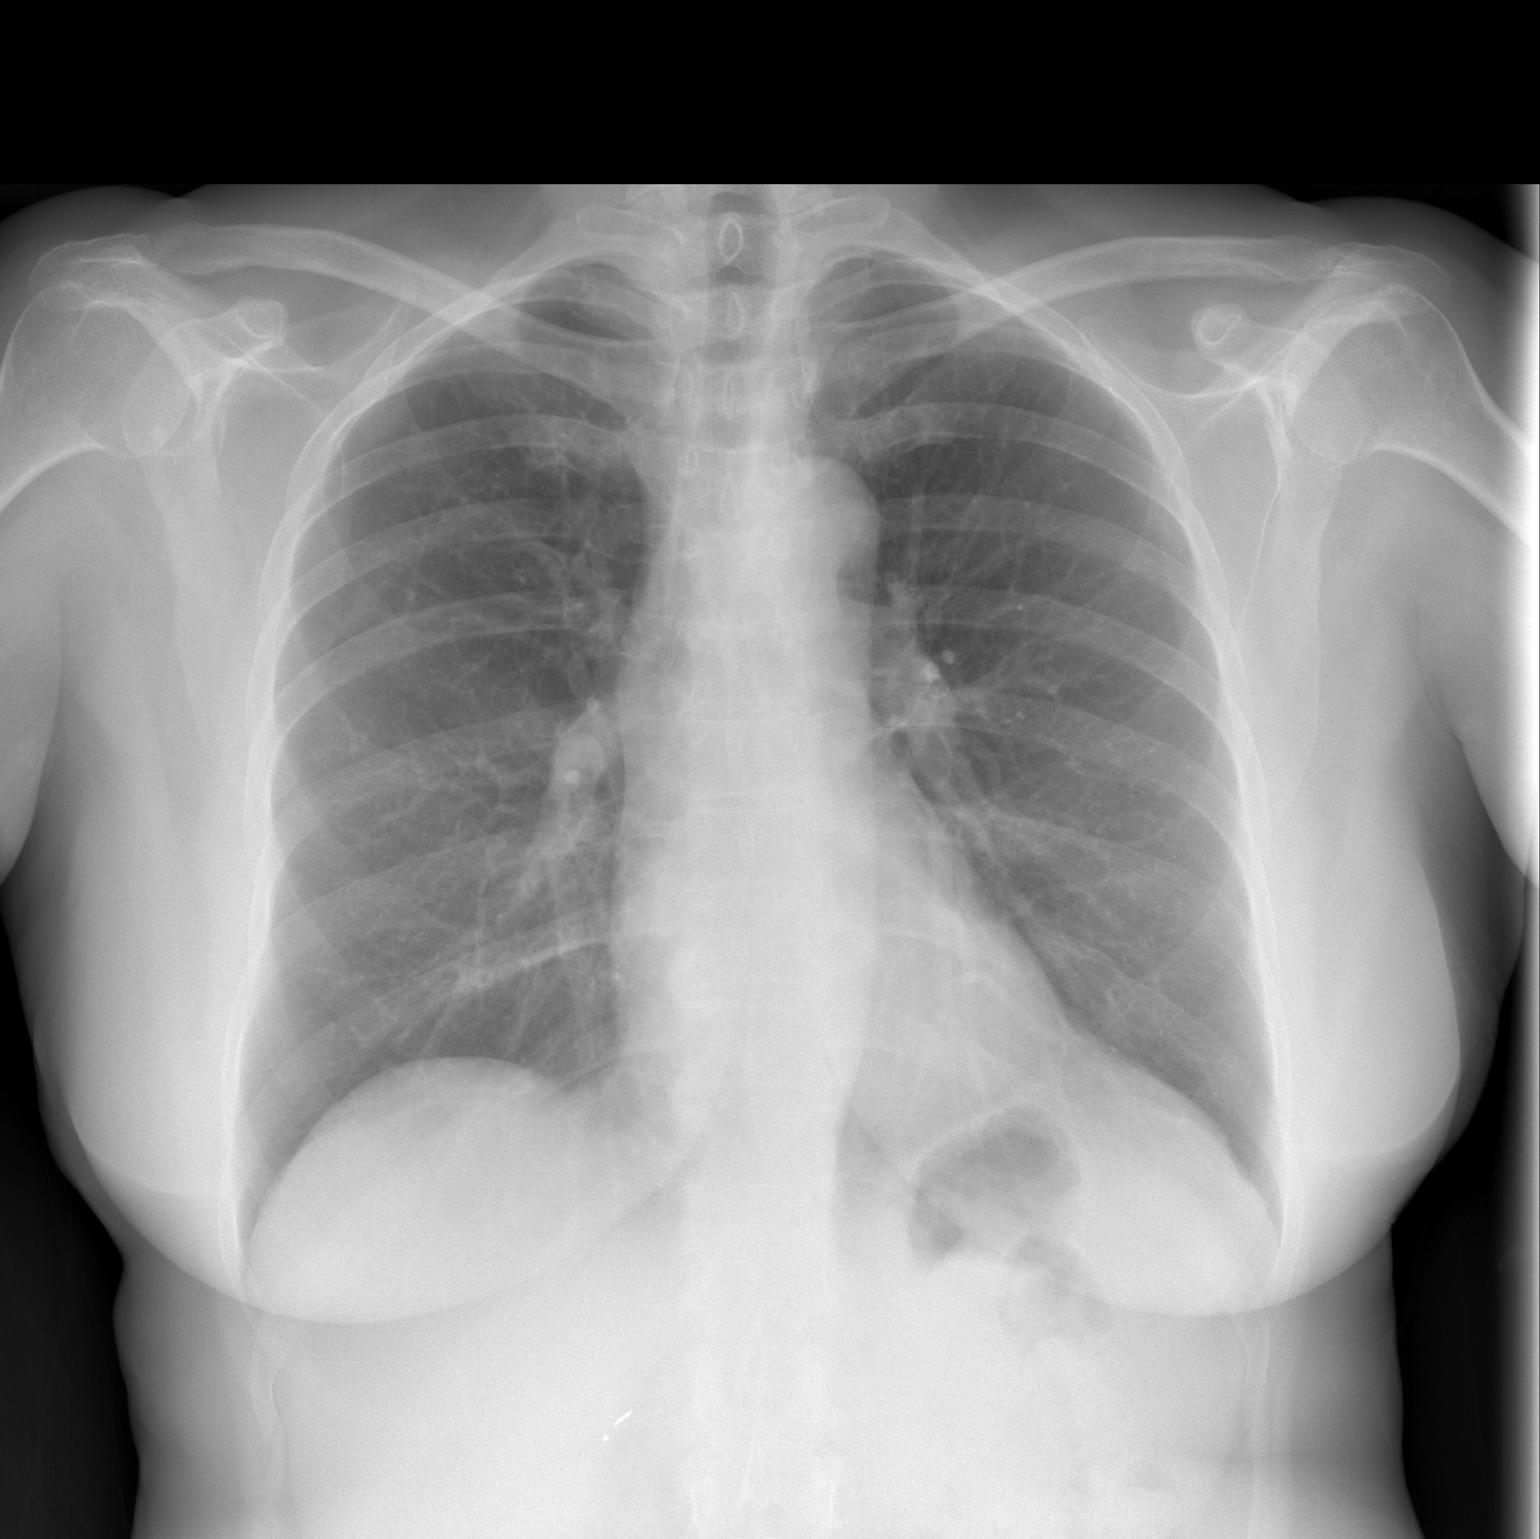

[w chest lat]
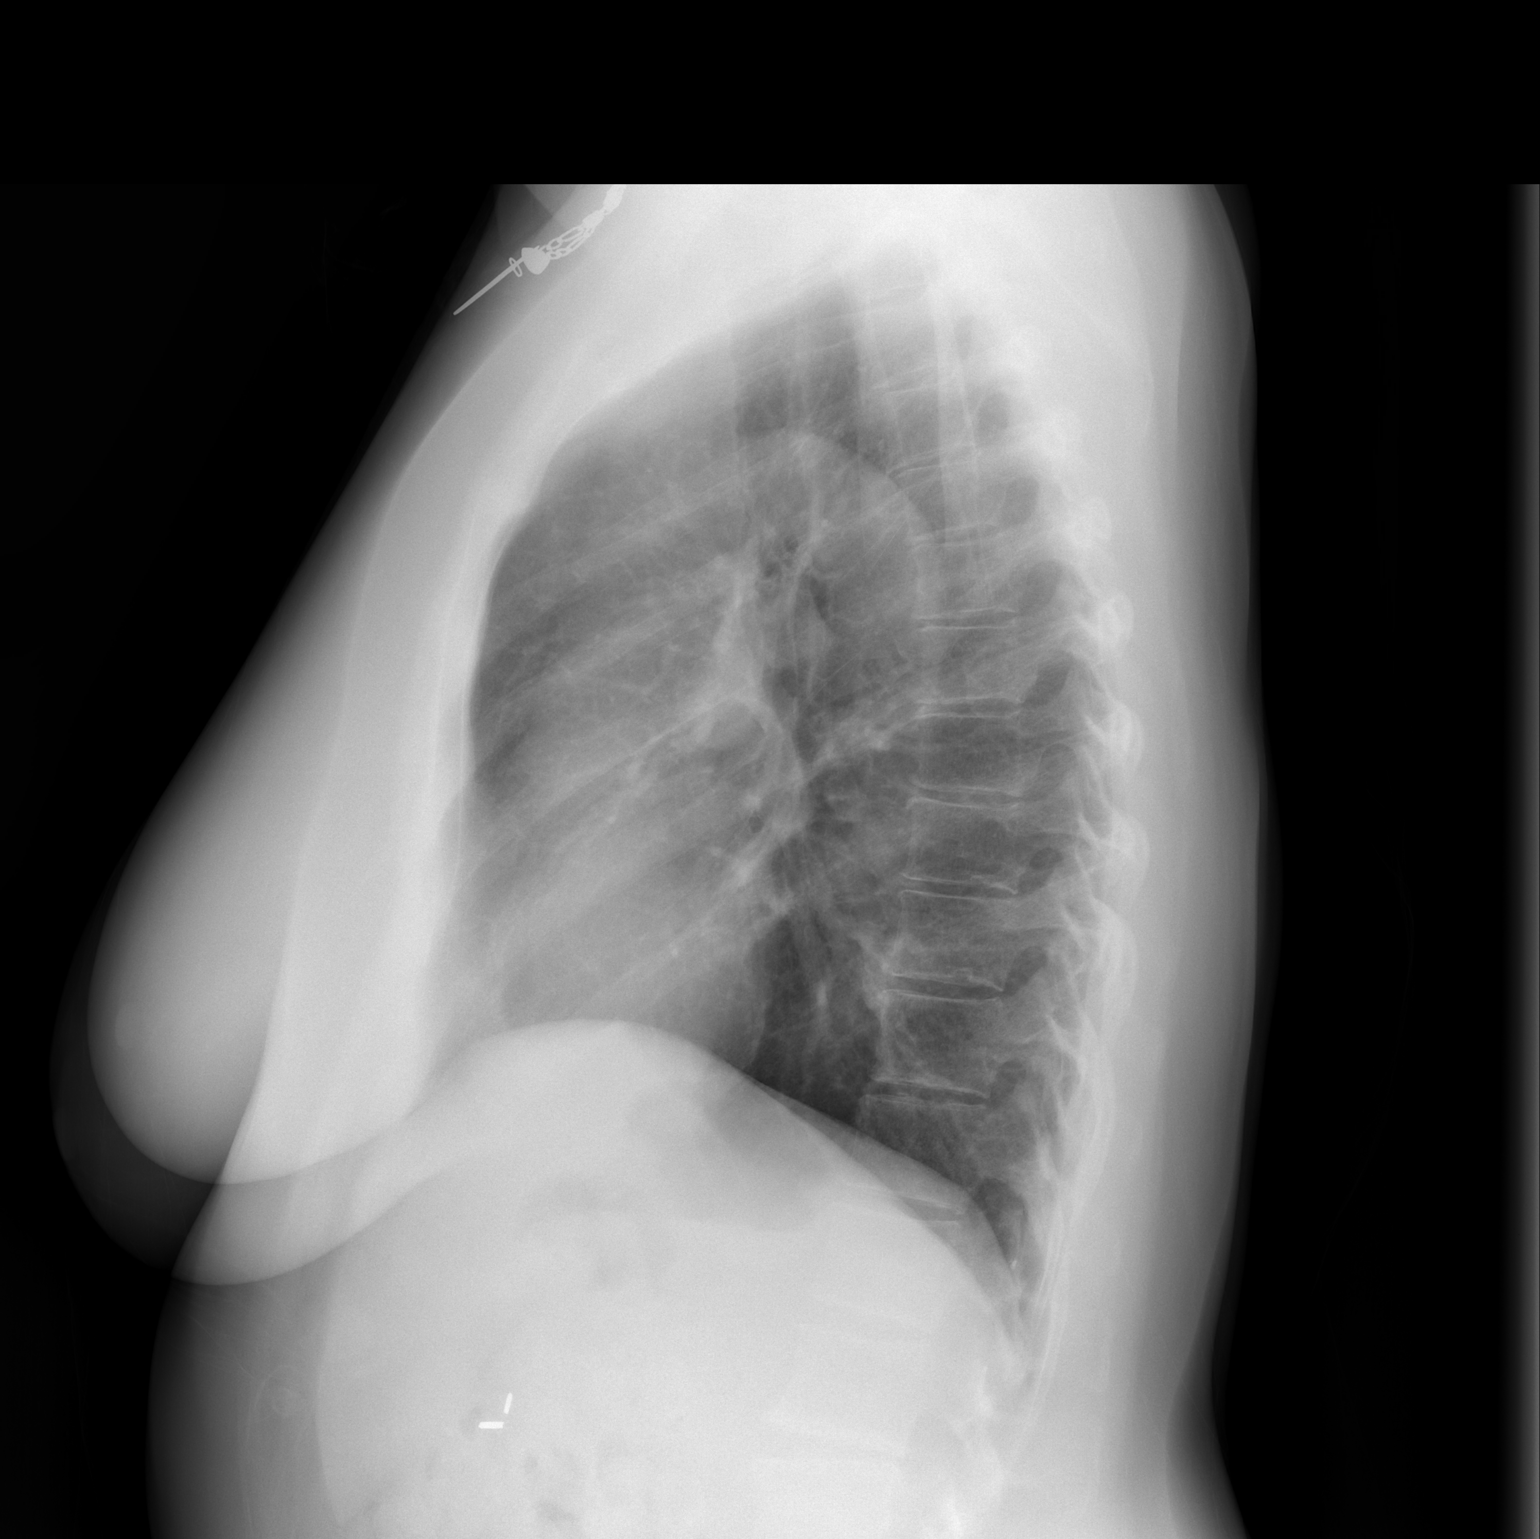

[2 of 2 positions shown; findings below may reference images not displayed]

FINDINGS: Lungs are clear. No pleural effusion or pneumothorax. The
cardiomediastinal contours are within normal limits. The visualized
bones and soft tissues are without significant appreciable
abnormality.  Surgical clips right upper quadrant.
IMPRESSION: No radiographic evidence of acute cardiopulmonary process.

## 2012-10-04 ENCOUNTER — Encounter (INDEPENDENT_AMBULATORY_CARE_PROVIDER_SITE_OTHER): Payer: PRIVATE HEALTH INSURANCE | Admitting: Ophthalmology

## 2012-10-04 DIAGNOSIS — H43819 Vitreous degeneration, unspecified eye: Secondary | ICD-10-CM

## 2012-10-04 DIAGNOSIS — H348192 Central retinal vein occlusion, unspecified eye, stable: Secondary | ICD-10-CM

## 2012-10-04 DIAGNOSIS — H35039 Hypertensive retinopathy, unspecified eye: Secondary | ICD-10-CM

## 2012-10-04 DIAGNOSIS — H2510 Age-related nuclear cataract, unspecified eye: Secondary | ICD-10-CM

## 2012-10-04 DIAGNOSIS — I10 Essential (primary) hypertension: Secondary | ICD-10-CM

## 2012-10-05 ENCOUNTER — Other Ambulatory Visit (HOSPITAL_COMMUNITY): Payer: Self-pay | Admitting: Physician Assistant

## 2012-10-05 DIAGNOSIS — E782 Mixed hyperlipidemia: Secondary | ICD-10-CM

## 2012-10-05 DIAGNOSIS — I10 Essential (primary) hypertension: Secondary | ICD-10-CM

## 2012-10-07 ENCOUNTER — Ambulatory Visit (HOSPITAL_COMMUNITY)
Admission: RE | Admit: 2012-10-07 | Discharge: 2012-10-07 | Disposition: A | Discharge status: Home / Self Care | Payer: PRIVATE HEALTH INSURANCE | Source: Ambulatory Visit | Attending: Cardiovascular Disease | Admitting: Cardiovascular Disease

## 2012-10-07 DIAGNOSIS — R079 Chest pain, unspecified: Secondary | ICD-10-CM | POA: Insufficient documentation

## 2012-10-07 DIAGNOSIS — R110 Nausea: Secondary | ICD-10-CM | POA: Insufficient documentation

## 2012-10-07 DIAGNOSIS — R5381 Other malaise: Secondary | ICD-10-CM | POA: Insufficient documentation

## 2012-10-07 DIAGNOSIS — I10 Essential (primary) hypertension: Secondary | ICD-10-CM | POA: Insufficient documentation

## 2012-10-07 DIAGNOSIS — E782 Mixed hyperlipidemia: Secondary | ICD-10-CM | POA: Insufficient documentation

## 2012-10-07 MED ORDER — REGADENOSON 0.4 MG/5ML IV SOLN
0.4000 mg | Freq: Once | INTRAVENOUS | Status: AC
  Administered 2012-10-07: 0.4 mg via INTRAVENOUS

## 2012-10-07 MED ORDER — TECHNETIUM TC 99M SESTAMIBI GENERIC - CARDIOLITE
10.7000 | Freq: Once | INTRAVENOUS | Status: AC | PRN
  Administered 2012-10-07: 11 via INTRAVENOUS

## 2012-10-07 MED ORDER — TECHNETIUM TC 99M SESTAMIBI GENERIC - CARDIOLITE
30.1000 | Freq: Once | INTRAVENOUS | Status: AC | PRN
  Administered 2012-10-07: 30.1 via INTRAVENOUS

## 2012-10-07 MED ORDER — AMINOPHYLLINE 25 MG/ML IV SOLN
75.0000 mg | Freq: Once | INTRAVENOUS | Status: AC
  Administered 2012-10-07: 75 mg via INTRAVENOUS

## 2012-10-07 NOTE — Procedures (Addendum)
[facility] [address] [place] [address] [phone]  Cardiology Nuclear Med Study  Bianca Ferguson is a 62 y.o. female     MRN : [id number]     DOB: 04/18/1951  Procedure Date: 10/07/2012  Nuclear Med Background Indication for Stress Test:  Evaluation for Ischemia History:  PTCA-1998 Cardiac Risk Factors: Family History - CAD, History of Smoking, Hypertension, Lipids and Obesity  Symptoms:  Chest Pain and Fatigue   Nuclear Pre-Procedure Caffeine/Decaff Intake:  10:00pm NPO After: 8:00am   IV Site: R Antecubital  IV 0.9% NS with Angio Cath:  22g  Chest Size (in):  N/ IV Started by: Emmit Pomfret, RN  Height: 5\' 4"  (1.626 m)  Cup Size: G  BMI:  Body mass index is 32.77 kg/(m^2). Weight:  191 lb (86.637 kg)   Tech Comments:  N/A    Nuclear Med Study 1 or 2 day study: 1 day  Stress Test Type:  Lexiscan  Order Authorizing Provider:  Benny Lennert   Resting Radionuclide: Technetium 66m Sestamibi  Resting Radionuclide Dose: 10.7 mCi   Stress Radionuclide:  Technetium 46m Sestamibi  Stress Radionuclide Dose: 30.1 mCi           Stress Protocol Rest HR: 70 Stress HR: 98  Rest BP: 137/95 Stress BP: 158/93  Exercise Time (min): n/a METS: n/a   Predicted Max HR: 161 bpm % Max HR: 60.87 bpm Rate Pressure Product: 27253  Dose of Adenosine (mg):  n/a Dose of Lexiscan: 0.4 mg  Dose of Atropine (mg): n/a Dose of Dobutamine: n/a mcg/kg/min (at max HR)  Stress Test Technologist: Esperanza Sheets, CCT Nuclear Technologist: Gonzella Lex, CNMT   Rest Procedure:  Myocardial perfusion imaging was performed at rest 45 minutes following the intravenous administration of Technetium 58m Sestamibi. Stress Procedure:  The patient received IV Lexiscan 0.4 mg over 15-seconds.  Technetium 34m Sestamibi injected at 30-seconds.  The Patient experienced SOB and Nausea, 75 mg of IV Aminophylline was administered with total resolution  of symptoms.  There were no significant changes with Lexiscan.  Quantitative spect images were obtained after a 45 minute delay.  Transient Ischemic Dilatation (Normal <1.22):  0.87 Lung/Heart Ratio (Normal <0.45):  0.26 QGS EDV:  62 ml QGS ESV:  15 ml LV Ejection Fraction: 76% -- likely overestimated due to low LV volumes. (DH)  Signed by Pervis Hocking on 10/07/2012 at 12:14 PM.   PHYSICIAN INTERPRETATION:  Rest ECG: NSR with non-specific ST-T wave changes  Stress ECG: No significant change from baseline ECG  QPS Raw Data Images:  There is interference from nuclear activity from structures below the diaphragm. This does not affect the ability to read the study.;  Patient motion noted.;  Mild diaphragmatic attenuation; underestimated left ventricular size - leads to overestimation of EF Stress Images:  There is decreased uptake in the apical anterior wall - a small sized, mild intensity perfusion defect with partial reversibility. Rest Images:  There is decreased uptake in the lateral wall. Subtraction (SDS):  There is a small sized, mild intensity perfusion defect in the apical anterio wall that is partially reversible -- this may represent mild distal LAD ischemia vs shifting breast attenuation.  Impression Exercise Capacity:  Lexiscan with no exercise. BP Response:  Normal blood pressure response. Clinical Symptoms:  There is dyspnea. Nausea ECG Impression:  No significant ST segment change with adenosine. Comparison with Prior Nuclear Study: No previous nuclear study performed  LV Wall Motion:  NL LV Function; NL Wall Motion; EF likely overestimated due to small LV size.  Overall Impression: Abnormal pharmaceutical nuclear perfusion imaging. Based upon size and intensity of the anteroapical perfusion defect, this is a Low risk stress nuclear study.   [name], MD  10/07/2012 5:28 PM

## 2012-11-08 ENCOUNTER — Other Ambulatory Visit (HOSPITAL_COMMUNITY): Payer: Self-pay | Admitting: Cardiovascular Disease

## 2012-11-08 DIAGNOSIS — I119 Hypertensive heart disease without heart failure: Secondary | ICD-10-CM

## 2012-11-09 ENCOUNTER — Encounter (INDEPENDENT_AMBULATORY_CARE_PROVIDER_SITE_OTHER): Payer: PRIVATE HEALTH INSURANCE | Admitting: Ophthalmology

## 2012-11-09 DIAGNOSIS — H43819 Vitreous degeneration, unspecified eye: Secondary | ICD-10-CM

## 2012-11-09 DIAGNOSIS — H348192 Central retinal vein occlusion, unspecified eye, stable: Secondary | ICD-10-CM

## 2012-11-09 DIAGNOSIS — H2510 Age-related nuclear cataract, unspecified eye: Secondary | ICD-10-CM

## 2012-11-09 DIAGNOSIS — I10 Essential (primary) hypertension: Secondary | ICD-10-CM

## 2012-11-09 DIAGNOSIS — H35039 Hypertensive retinopathy, unspecified eye: Secondary | ICD-10-CM

## 2012-11-24 ENCOUNTER — Inpatient Hospital Stay (HOSPITAL_COMMUNITY): Admission: RE | Admit: 2012-11-24 | Payer: PRIVATE HEALTH INSURANCE | Source: Ambulatory Visit

## 2012-12-07 ENCOUNTER — Telehealth: Payer: Self-pay | Admitting: *Deleted

## 2012-12-07 ENCOUNTER — Ambulatory Visit (HOSPITAL_COMMUNITY)
Admission: RE | Admit: 2012-12-07 | Discharge: 2012-12-07 | Disposition: A | Discharge status: Home / Self Care | Payer: PRIVATE HEALTH INSURANCE | Source: Ambulatory Visit | Attending: Cardiovascular Disease | Admitting: Cardiovascular Disease

## 2012-12-07 DIAGNOSIS — I119 Hypertensive heart disease without heart failure: Secondary | ICD-10-CM

## 2012-12-07 DIAGNOSIS — I059 Rheumatic mitral valve disease, unspecified: Secondary | ICD-10-CM | POA: Insufficient documentation

## 2012-12-07 DIAGNOSIS — E785 Hyperlipidemia, unspecified: Secondary | ICD-10-CM | POA: Insufficient documentation

## 2012-12-07 DIAGNOSIS — I10 Essential (primary) hypertension: Secondary | ICD-10-CM | POA: Insufficient documentation

## 2012-12-07 DIAGNOSIS — I519 Heart disease, unspecified: Secondary | ICD-10-CM | POA: Insufficient documentation

## 2012-12-07 DIAGNOSIS — I517 Cardiomegaly: Secondary | ICD-10-CM | POA: Insufficient documentation

## 2012-12-07 NOTE — Telephone Encounter (Signed)
Walk-in: Pt c/o leg aching since restarting lipitor.  Stated this is the same as it was when she was on lipitor before.  Pt wants to know if there is something else she can be prescribed.  Pt informed Dr Tresa Endo will be notified for further instructions.  Pt has an appt w/ [facility] in this building at 3:15p and will wait for a response before her appt or come by afterwards to f/u with a response from Dr. Tresa Endo.  Pt agreed w/ plan.  Paper chart on Dr. Landry Dyke cart.

## 2012-12-07 NOTE — Progress Notes (Signed)
2D Echo Performed 12/07/2012    Alenna Russell, RCS  

## 2012-12-08 NOTE — Telephone Encounter (Signed)
Call to pt.  Informed no response from Dr. Tresa Endo as of yet.  Will try to discuss w/ Dr. Tresa Endo tomorrow morning when in clinic.  Pt verbalized understanding and agreed w/ plan.

## 2012-12-09 MED ORDER — ROSUVASTATIN CALCIUM 20 MG PO TABS: 20.0000 mg | ORAL_TABLET | Freq: Every day | ORAL | Status: DC

## 2012-12-09 NOTE — Telephone Encounter (Signed)
Call to pt and informed Dr. Tresa Endo advised Lipitor be changed to Crestor 20mg .  Rx sent to pharmacy.  Pt verbalized understanding and agreed w/ plan.

## 2012-12-10 ENCOUNTER — Telehealth: Payer: Self-pay | Admitting: *Deleted

## 2012-12-10 NOTE — Telephone Encounter (Signed)
Message copied by Gaynelle Cage on Fri Dec 10, 2012  5:54 PM ------      Message from: Nicki Guadalajara A      Created: Wed Dec 08, 2012  8:54 AM       NL LV FXN with LVH and GR I diastolic dysfunction ------

## 2012-12-10 NOTE — Telephone Encounter (Signed)
Called patient and gave echo results.

## 2012-12-17 ENCOUNTER — Encounter (INDEPENDENT_AMBULATORY_CARE_PROVIDER_SITE_OTHER): Payer: PRIVATE HEALTH INSURANCE | Admitting: Ophthalmology

## 2012-12-17 DIAGNOSIS — H348192 Central retinal vein occlusion, unspecified eye, stable: Secondary | ICD-10-CM

## 2012-12-17 DIAGNOSIS — H43819 Vitreous degeneration, unspecified eye: Secondary | ICD-10-CM

## 2012-12-17 DIAGNOSIS — H35039 Hypertensive retinopathy, unspecified eye: Secondary | ICD-10-CM

## 2012-12-17 DIAGNOSIS — H2510 Age-related nuclear cataract, unspecified eye: Secondary | ICD-10-CM

## 2012-12-17 DIAGNOSIS — I10 Essential (primary) hypertension: Secondary | ICD-10-CM

## 2013-01-03 ENCOUNTER — Other Ambulatory Visit: Payer: Self-pay | Admitting: Cardiovascular Disease

## 2013-01-03 LAB — COMPREHENSIVE METABOLIC PANEL
AST: 27 U/L (ref 0–37)
Alkaline Phosphatase: 88 U/L (ref 39–117)
BUN: 16 mg/dL (ref 6–23)
Calcium: 9.8 mg/dL (ref 8.4–10.5)
Creat: 0.62 mg/dL (ref 0.50–1.10)
Total Bilirubin: 0.5 mg/dL (ref 0.3–1.2)

## 2013-01-03 LAB — LIPID PANEL
HDL: 45 mg/dL (ref >39)
LDL Cholesterol: 103 mg/dL — ABNORMAL HIGH (ref 0–99)
Total CHOL/HDL Ratio: 4 Ratio

## 2013-01-04 ENCOUNTER — Encounter: Payer: Self-pay | Admitting: Cardiovascular Disease

## 2013-01-04 ENCOUNTER — Ambulatory Visit (INDEPENDENT_AMBULATORY_CARE_PROVIDER_SITE_OTHER): Payer: PRIVATE HEALTH INSURANCE | Admitting: Cardiovascular Disease

## 2013-01-04 VITALS — BP 118/82 | HR 68 | Ht 64.0 in | Wt 192.6 lb

## 2013-01-04 DIAGNOSIS — Z79899 Other long term (current) drug therapy: Secondary | ICD-10-CM

## 2013-01-04 DIAGNOSIS — I5189 Other ill-defined heart diseases: Secondary | ICD-10-CM

## 2013-01-04 DIAGNOSIS — I519 Heart disease, unspecified: Secondary | ICD-10-CM

## 2013-01-04 DIAGNOSIS — I10 Essential (primary) hypertension: Secondary | ICD-10-CM

## 2013-01-04 DIAGNOSIS — E782 Mixed hyperlipidemia: Secondary | ICD-10-CM

## 2013-01-04 DIAGNOSIS — H3561 Retinal hemorrhage, right eye: Secondary | ICD-10-CM

## 2013-01-04 DIAGNOSIS — E785 Hyperlipidemia, unspecified: Secondary | ICD-10-CM

## 2013-01-04 DIAGNOSIS — E669 Obesity, unspecified: Secondary | ICD-10-CM

## 2013-01-04 DIAGNOSIS — H3560 Retinal hemorrhage, unspecified eye: Secondary | ICD-10-CM

## 2013-01-04 MED ORDER — ROSUVASTATIN CALCIUM 40 MG PO TABS: 40.0000 mg | ORAL_TABLET | Freq: Every day | ORAL | Status: DC

## 2013-01-04 NOTE — Patient Instructions (Addendum)
Your physician has recommended you make the following change in your medication: increase your crestor to 40mg .  Your physician recommends that you return for lab work in: 3  months.  Your physician recommends that you schedule a follow-up appointment in: 6 months.

## 2013-01-05 NOTE — Progress Notes (Signed)
Quick Note:  Patient had appointment with Dr. Tresa Endo results reviewed at that time. ______

## 2013-01-11 ENCOUNTER — Encounter: Payer: Self-pay | Admitting: Cardiovascular Disease

## 2013-01-11 DIAGNOSIS — H3561 Retinal hemorrhage, right eye: Secondary | ICD-10-CM | POA: Insufficient documentation

## 2013-01-11 DIAGNOSIS — I10 Essential (primary) hypertension: Secondary | ICD-10-CM | POA: Insufficient documentation

## 2013-01-11 DIAGNOSIS — E785 Hyperlipidemia, unspecified: Secondary | ICD-10-CM | POA: Insufficient documentation

## 2013-01-11 DIAGNOSIS — E669 Obesity, unspecified: Secondary | ICD-10-CM | POA: Insufficient documentation

## 2013-01-11 DIAGNOSIS — I519 Heart disease, unspecified: Secondary | ICD-10-CM | POA: Insufficient documentation

## 2013-01-11 DIAGNOSIS — I5189 Other ill-defined heart diseases: Secondary | ICD-10-CM | POA: Insufficient documentation

## 2013-01-11 NOTE — Progress Notes (Addendum)
Patient ID: Bianca Ferguson, female   DOB: 16-Jul-1950, 62 y.o.   MRN: [id number]     HPI: Bianca Ferguson, is a 62 y.o. female who presents to the office today for followup dilation of her hyperlipidemia.  Ms., Bianca Ferguson is very pleasant 62 year old female who has a history of marked hyperlipidemia as well as hypertension. In December 2011 I was taking care of her mother who was visiting [place] and her mother was found to have significant coronary artery disease and required coronary intervention. I did see the patient after her mother's evaluation. The patient has history of significant hyperlipidemia with cholesterols greater than 400. She has a history of obesity as well as hypertension and had lost significant amount of weight. After losing her weight she apparently had stopped her medications. She also has a history of attention deficit hyperactivity disorder. Over the past year, she did sustain a fractured right tibia ganglion cyst and torn meniscus of the right knee to have episodes of increasing blood pressure spikes. She ultimately was found to have a hemorrhage in her right eye trochlear edema vulva Dr. Ashley Royalty with improvement. Microinfusion study this year revealed mild antral apical thinning. An echo Doppler study 12/07/2012 showed normal LV function with mild left ventricular hypertrophy. 2 grade 1 diastolic dysfunction. She did have mild mitral regurgitation the laboratory March 2014 showed a cholesterol of 310 triglycerides 232 HDL 63 LDL 201 and at that time she was started back on atorvastatin 80 mg. I saw her in April 2014 over the last several months she has continued to improve. Her vision has improved all taking a Bason in addition to multiple additional eyedrops. She is now back with exercise and is walking at least 1 1/2 miles a day on a treadmill.  She presents for evaluation.  Past Medical History  Diagnosis Date  . Arthritis   . Anxiety   . ADD (attention  deficit disorder with hyperactivity)   . GERD (gastroesophageal reflux disease)   . Diverticulitis 04/2011  . Hypertension     no longer treated bp wnl x 1 year  . Hyperlipidemia   . Retinal vein occlusion     has appointment w Dr. Ashley Royalty 2/24    Past Surgical History  Procedure Laterality Date  . Back surgery    . Abdominal hysterectomy    . Appendectomy    . Cholecystectomy    . 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 Knee arthroscopy  01/07/2012    Procedure: ARTHROSCOPY KNEE;  Surgeon: [name], MD;  Location: [facility];  Service: Orthopedics;  Laterality: Right;  medial menical debridement, chondroplasty  . Knee arthroscopy Right 09/08/2012    Procedure: ARTHROSCOPY KNEE;  Surgeon: [name], MD;  Location: [facility];  Service: Orthopedics;  Laterality: Right;  WITH DEBRIDEMENT     Allergies  Allergen Reactions  . Codeine Itching    " all pain meds" needs benadryl with them  . Darvocet (Propoxyphene-Acetaminophen) Hives  . Morphine And Related Itching  . Percodan (Oxycodone-Aspirin) Hives    1970's rash    Current Outpatient Prescriptions  Medication Sig Dispense Refill  . aspirin EC 81 MG tablet Take 81 mg by mouth daily.      Marland Kitchen azelastine (OPTIVAR) 0.05 % ophthalmic solution 1 drop in both eyes 4 times a day for 48 hours after shots.      . Biotin 10 MG CAPS Take 1 capsule  by mouth daily.      . Calcium-Vitamin D-Vitamin K (CALCIUM SOFT CHEWS PO) Take 500 mg by mouth 2 (two) times daily.      . Coenzyme Q10 (CO Q 10) 100 MG CAPS Take 3 capsules by mouth daily.      . diphenhydrAMINE (BENADRYL) 25 MG tablet Take 50 mg by mouth every 6 (six) hours as needed.      . DULoxetine (CYMBALTA) 60 MG capsule Take 60 mg by mouth at bedtime.      . fluticasone (FLONASE) 50 MCG/ACT nasal spray Place 1 spray into the nose at bedtime.      Marland Kitchen ibuprofen (ADVIL,MOTRIN) 200 MG tablet Take 600 mg by mouth every  6 (six) hours as needed. For pain      . losartan (COZAAR) 50 MG tablet Take 50 mg by mouth daily.      . methocarbamol (ROBAXIN) 500 MG tablet Take 1 tablet (500 mg total) by mouth 4 (four) times daily.  30 tablet  1  . Multiple Vitamin (MULTIVITAMIN) tablet Take 1 tablet by mouth daily.      . Omega-3 Fatty Acids (FISH OIL) 1200 MG CAPS Take 1 capsule by mouth daily.      . pantoprazole (PROTONIX) 40 MG tablet Take 20 mg by mouth 2 (two) times daily.       . RESTASIS 0.05 % ophthalmic emulsion Place 1 drop into both eyes daily.      . rosuvastatin (CRESTOR) 40 MG tablet Take 1 tablet (40 mg total) by mouth daily.  90 tablet  3  . rosuvastatin (CRESTOR) 40 MG tablet Take 1 tablet (40 mg total) by mouth daily.  90 tablet  3   No current facility-administered medications for this visit.    Socially she is married her she is usually from the [place] area. She has 2 children. His remote tobacco history but she quit in 2000.  ROS is negative for fevers, chills or night sweats. She states her vision in her right eye is improving. She denies shortness of breath. She denies chest pressure. She thinks her blood pressure has been more stable with resumption of medical therapy. She denies bleeding. She denies abdominal pain. She denies per the history she denies presyncope. She denies significant edema.  Other system review is negative.  PE BP 118/82  Pulse 68  Ht 5\' 4"  (1.626 m)  Wt 192 lb 9.6 oz (87.363 kg)  BMI 33.04 kg/m2  General: Alert, oriented, no distress.  Skin: normal turgor, no rashes HEENT: Normocephalic, atraumatic. Pupils round and reactive; sclera anicteric;no lid lag.  Nose without nasal septal hypertrophy Mouth/Parynx benign; Mallinpatti scale 2 Neck: No JVD, no carotid briuts Lungs: clear to ausculatation and percussion; no wheezing or rales Heart: RRR, s1 s2 normal 1-2/6 systolic murmur. No S3 or S4 gallop. Abdomen: soft, nontender; no hepatosplenomehaly, BS+; abdominal  aorta nontender and not dilated by palpation. Pulses 2+ Extremities: no clubbing cyanosis or edema, Homan's sign negative  Neurologic: grossly nonfocal    LABS:  BMET    Component Value Date/Time   NA 141 01/03/2013 1042   K 4.8 01/03/2013 1042   CL 105 01/03/2013 1042   CO2 27 01/03/2013 1042   GLUCOSE 97 01/03/2013 1042   BUN 16 01/03/2013 1042   CREATININE 0.62 01/03/2013 1042   CREATININE 0.65 01/01/2012 0925   CALCIUM 9.8 01/03/2013 1042   GFRNONAA >90 01/01/2012 0925   GFRAA >90 01/01/2012 0925     Hepatic Function  Panel     Component Value Date/Time   PROT 6.6 01/03/2013 1042   ALBUMIN 4.0 01/03/2013 1042   AST 27 01/03/2013 1042   ALT 30 01/03/2013 1042   ALKPHOS 88 01/03/2013 1042   BILITOT 0.5 01/03/2013 1042     CBC    Component Value Date/Time   WBC 6.8 01/01/2012 0925   RBC 4.84 01/01/2012 0925   HGB 12.9 09/08/2012 0814   HCT 40.8 01/01/2012 0925   PLT 367 01/01/2012 0925   MCV 84.3 01/01/2012 0925   MCH 27.3 01/01/2012 0925   MCHC 32.4 01/01/2012 0925   RDW 13.0 01/01/2012 0925     BNP No results found for this basename: probnp    Lipid Panel     Component Value Date/Time   CHOL 180 01/03/2013 1042   TRIG 160* 01/03/2013 1042   HDL 45 01/03/2013 1042   CHOLHDL 4.0 01/03/2013 1042   VLDL 32 01/03/2013 1042   LDLCALC 103* 01/03/2013 1042     RADIOLOGY: No results found.   ASSESSMENT AND PLAN: Ms. Boyte continues to do well with resumption of medical therapy. Her blood pressure today is well controlled on losartan 50 mg daily. Her most recent lipid panel shows marked improvement in her LDL from 201-103. Her triglycerides are still slightly elevated at 160. I recommended further titration of her Crestor from 20 mg to 40 mg with a strong family history for coronary artery disease and risk factors we discussed the importance of weight loss continued increased exercise and reduction rehydrates/sweets/sugars. She will have repeat labs in 3 months and I will see  her back in the office in 6 months for followup cardiology evaluation.     Lennette Bihari, MD, [facility]  01/11/2013 9:22 AM

## 2013-02-04 ENCOUNTER — Encounter (INDEPENDENT_AMBULATORY_CARE_PROVIDER_SITE_OTHER): Payer: PRIVATE HEALTH INSURANCE | Admitting: Ophthalmology

## 2013-02-04 DIAGNOSIS — H35039 Hypertensive retinopathy, unspecified eye: Secondary | ICD-10-CM

## 2013-02-04 DIAGNOSIS — H348192 Central retinal vein occlusion, unspecified eye, stable: Secondary | ICD-10-CM

## 2013-02-04 DIAGNOSIS — H2510 Age-related nuclear cataract, unspecified eye: Secondary | ICD-10-CM

## 2013-02-04 DIAGNOSIS — H43819 Vitreous degeneration, unspecified eye: Secondary | ICD-10-CM

## 2013-02-04 DIAGNOSIS — I10 Essential (primary) hypertension: Secondary | ICD-10-CM

## 2013-02-11 ENCOUNTER — Other Ambulatory Visit: Payer: Self-pay | Admitting: *Deleted

## 2013-02-11 ENCOUNTER — Encounter: Payer: Self-pay | Admitting: *Deleted

## 2013-02-11 DIAGNOSIS — E782 Mixed hyperlipidemia: Secondary | ICD-10-CM

## 2013-02-11 DIAGNOSIS — E785 Hyperlipidemia, unspecified: Secondary | ICD-10-CM

## 2013-02-11 DIAGNOSIS — Z79899 Other long term (current) drug therapy: Secondary | ICD-10-CM

## 2013-04-08 ENCOUNTER — Encounter (INDEPENDENT_AMBULATORY_CARE_PROVIDER_SITE_OTHER): Payer: PRIVATE HEALTH INSURANCE | Admitting: Ophthalmology

## 2013-04-08 DIAGNOSIS — I10 Essential (primary) hypertension: Secondary | ICD-10-CM

## 2013-04-08 DIAGNOSIS — H2510 Age-related nuclear cataract, unspecified eye: Secondary | ICD-10-CM

## 2013-04-08 DIAGNOSIS — H43819 Vitreous degeneration, unspecified eye: Secondary | ICD-10-CM

## 2013-04-08 DIAGNOSIS — H348192 Central retinal vein occlusion, unspecified eye, stable: Secondary | ICD-10-CM

## 2013-04-08 DIAGNOSIS — H35039 Hypertensive retinopathy, unspecified eye: Secondary | ICD-10-CM

## 2013-06-14 ENCOUNTER — Other Ambulatory Visit: Payer: Self-pay | Admitting: Orthopedic Surgery

## 2013-06-14 NOTE — Progress Notes (Signed)
Preoperative surgical orders have been place into the Epic hospital system for [facility] on 06/14/2013, 1:21 PM  by Patrica Duel for surgery on 07-11-2013.  Preop Total Knee orders including Experal, PO Tylenol, and IV Decadron as long as there are no contraindications to the above medications. Avel Peace, PA-C

## 2013-06-28 ENCOUNTER — Encounter (HOSPITAL_COMMUNITY): Payer: Self-pay | Admitting: Pharmacy Technician

## 2013-06-29 ENCOUNTER — Ambulatory Visit (INDEPENDENT_AMBULATORY_CARE_PROVIDER_SITE_OTHER): Payer: PRIVATE HEALTH INSURANCE | Admitting: Cardiovascular Disease

## 2013-06-29 ENCOUNTER — Encounter: Payer: Self-pay | Admitting: Cardiovascular Disease

## 2013-06-29 VITALS — BP 118/80 | Ht 65.0 in | Wt 196.2 lb

## 2013-06-29 DIAGNOSIS — I519 Heart disease, unspecified: Secondary | ICD-10-CM

## 2013-06-29 DIAGNOSIS — E785 Hyperlipidemia, unspecified: Secondary | ICD-10-CM

## 2013-06-29 DIAGNOSIS — I10 Essential (primary) hypertension: Secondary | ICD-10-CM

## 2013-06-29 DIAGNOSIS — I5189 Other ill-defined heart diseases: Secondary | ICD-10-CM

## 2013-06-29 DIAGNOSIS — E66811 Obesity, class 1: Secondary | ICD-10-CM

## 2013-06-29 DIAGNOSIS — E669 Obesity, unspecified: Secondary | ICD-10-CM

## 2013-06-29 LAB — COMPREHENSIVE METABOLIC PANEL
AST: 27 U/L (ref 0–37)
Albumin: 4.1 g/dL (ref 3.5–5.2)
BUN: 13 mg/dL (ref 6–23)
Calcium: 9.7 mg/dL (ref 8.4–10.5)
Chloride: 102 mEq/L (ref 96–112)
Potassium: 4.5 mEq/L (ref 3.5–5.3)

## 2013-06-29 LAB — LIPID PANEL
Cholesterol: 175 mg/dL (ref 0–200)
VLDL: 45 mg/dL — ABNORMAL HIGH (ref 0–40)

## 2013-06-29 NOTE — Patient Instructions (Signed)
You are cleared for surgery  Your physician recommends that you schedule a follow-up appointment in: 3 Months

## 2013-06-30 ENCOUNTER — Other Ambulatory Visit: Payer: Self-pay | Admitting: Orthopedic Surgery

## 2013-06-30 ENCOUNTER — Ambulatory Visit (HOSPITAL_COMMUNITY)
Admission: RE | Admit: 2013-06-30 | Discharge: 2013-06-30 | Disposition: A | Discharge status: Home / Self Care | Payer: PRIVATE HEALTH INSURANCE | Source: Ambulatory Visit | Attending: Orthopedic Surgery | Admitting: Orthopedic Surgery

## 2013-06-30 ENCOUNTER — Encounter: Payer: Self-pay | Admitting: Cardiovascular Disease

## 2013-06-30 ENCOUNTER — Encounter (HOSPITAL_COMMUNITY)
Admission: RE | Admit: 2013-06-30 | Discharge: 2013-06-30 | Disposition: A | Discharge status: Home / Self Care | Payer: PRIVATE HEALTH INSURANCE | Source: Ambulatory Visit | Attending: Orthopedic Surgery | Admitting: Orthopedic Surgery

## 2013-06-30 ENCOUNTER — Encounter (HOSPITAL_COMMUNITY): Payer: Self-pay

## 2013-06-30 DIAGNOSIS — Z01812 Encounter for preprocedural laboratory examination: Secondary | ICD-10-CM | POA: Insufficient documentation

## 2013-06-30 DIAGNOSIS — Z01818 Encounter for other preprocedural examination: Secondary | ICD-10-CM | POA: Insufficient documentation

## 2013-06-30 DIAGNOSIS — I10 Essential (primary) hypertension: Secondary | ICD-10-CM | POA: Insufficient documentation

## 2013-06-30 HISTORY — DX: Reserved for inherently not codable concepts without codable children: IMO0001

## 2013-06-30 HISTORY — DX: Adverse effect of unspecified anesthetic, initial encounter: T41.45XA

## 2013-06-30 HISTORY — DX: Other complications of anesthesia, initial encounter: T88.59XA

## 2013-06-30 LAB — CBC
HCT: 40.1 % (ref 36.0–46.0)
MCH: 27.4 pg (ref 26.0–34.0)
MCHC: 32.9 g/dL (ref 30.0–36.0)
MCV: 83.4 fL (ref 78.0–100.0)
Platelets: 384 10*3/uL (ref 150–400)
RDW: 12.9 % (ref 11.5–15.5)

## 2013-06-30 LAB — URINALYSIS, ROUTINE W REFLEX MICROSCOPIC
Bilirubin Urine: NEGATIVE
Leukocytes, UA: NEGATIVE
Nitrite: NEGATIVE
Specific Gravity, Urine: 1.015 (ref 1.005–1.030)
Urobilinogen, UA: 0.2 mg/dL (ref 0.0–1.0)

## 2013-06-30 LAB — PROTIME-INR
INR: 0.99 (ref 0.00–1.49)
Prothrombin Time: 12.9 seconds (ref 11.6–15.2)

## 2013-06-30 LAB — COMPREHENSIVE METABOLIC PANEL
Albumin: 3.9 g/dL (ref 3.5–5.2)
BUN: 14 mg/dL (ref 6–23)
Calcium: 9.7 mg/dL (ref 8.4–10.5)
Creatinine, Ser: 0.62 mg/dL (ref 0.50–1.10)
GFR calc Af Amer: >90 mL/min (ref >90)
Glucose, Bld: 83 mg/dL (ref 70–99)
Total Bilirubin: 0.5 mg/dL (ref 0.3–1.2)
Total Protein: 7.4 g/dL (ref 6.0–8.3)

## 2013-06-30 LAB — SURGICAL PCR SCREEN: Staphylococcus aureus: NEGATIVE

## 2013-06-30 NOTE — Progress Notes (Signed)
Patient ID: Bianca Ferguson, female   DOB: April 23, 1951, 62 y.o.   MRN: [id number]      HPI: Bianca Ferguson, is a 62 y.o. female who presents to the office today for followup cardiology evaluation prior to undergoing right knee replacement surgery by Dr. Ethlyn Gallery on 07/11/2013.  Ms., Bianca Ferguson is very pleasant 62 year old female who has a history of marked hyperlipidemia as well as hypertension. In December 2011 I was taking care of her mother who was visiting [place] and her mother was found to have significant coronary artery disease and required coronary intervention. I did see the patient after her mother's evaluation. The patient has history of significant hyperlipidemia with cholesterols greater than 400. She has a history of obesity as well as hypertension and had lost significant amount of weight. After losing her weight she apparently had stopped her medications. She also has a history of attention deficit hyperactivity disorder. Over the past year, she did sustain a fractured right tibia and has a ganglion cyst and torn meniscus of the right knee.  She had had episodes of increasing blood pressure spikes. She ultimately was found to have a hemorrhage in her right eye tand had orbital edema treated by Dr. Ashley Royalty with improvement. A nuclear perfusion study in March 2014 study this year revealed mild antero- apical thinning. An echo Doppler study 12/07/2012 showed normal LV function with mild left ventricular hypertrophy and grade 1 diastolic dysfunction. She did have mild mitral regurgitation.  Laboratory March 2014 showed a cholesterol of 310 triglycerides 232 HDL 63 LDL 201 and at that time she was started back on atorvastatin 80 mg. I saw her in April 2014 over the last several months she has continued to improve.  Recently, she has felt well. She specifically denies episodes of chest pain or shortness of breath. She did undergo followup laboratory on 06/29/2013 which continues  to show significant benefit on her current dose of Crestor 40 mg fish oil. Total cholesterol was now 175, HDL 49, LDL 81. However, she still had increased triglycerides at 226 and increased VLDL cholesterol of 45. She states her blood pressure has been controlled on her current medical regimen which consists of Cozaar 50 mg. She presents for preoperative assessment.  Past Medical History  Diagnosis Date  . Arthritis   . Anxiety   . ADD (attention deficit disorder with hyperactivity)   . GERD (gastroesophageal reflux disease)   . Diverticulitis 04/2011  . Hypertension     no longer treated bp wnl x 1 year  . Hyperlipidemia   . Retinal vein occlusion     has appointment w Dr. Ashley Royalty 2/24  . Complication of anesthesia     nausea  . Frequency     Past Surgical History  Procedure Laterality Date  . Back surgery    . Abdominal hysterectomy    . Appendectomy    . Cholecystectomy    . 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 Knee arthroscopy  01/07/2012    Procedure: ARTHROSCOPY KNEE;  Surgeon: [name], MD;  Location: [facility];  Service: Orthopedics;  Laterality: Right;  medial menical debridement, chondroplasty  . Knee arthroscopy Right 09/08/2012    Procedure: ARTHROSCOPY KNEE;  Surgeon: [name], MD;  Location: [facility];  Service: Orthopedics;  Laterality: Right;  WITH DEBRIDEMENT     Allergies  Allergen Reactions  . Codeine Itching    " all pain meds"  needs benadryl with them  . Darvocet [Propoxyphene-Acetaminophen] Hives  . Morphine And Related Itching  . Percodan [Oxycodone-Aspirin] Hives    1970's rash    Current Outpatient Prescriptions  Medication Sig Dispense Refill  . Biotin 10 MG CAPS Take 1 capsule by mouth daily.      . busPIRone (BUSPAR) 5 MG tablet Take 1 tablet by mouth daily.      . Calcium-Vitamin D-Vitamin K (CALCIUM SOFT CHEWS PO) Take 500 mg by mouth 2 (two) times daily.      .  Coenzyme Q10 (CO Q 10) 100 MG CAPS Take 3 capsules by mouth daily.      . diphenhydrAMINE (BENADRYL) 25 MG tablet Take 50 mg by mouth every 6 (six) hours as needed.      . DULoxetine (CYMBALTA) 60 MG capsule Take 60 mg by mouth at bedtime.      . fluticasone (FLONASE) 50 MCG/ACT nasal spray Place 1 spray into the nose at bedtime.      Marland Kitchen ibuprofen (ADVIL,MOTRIN) 200 MG tablet Take 600 mg by mouth every 6 (six) hours as needed. For pain      . losartan (COZAAR) 50 MG tablet Take 50 mg by mouth at bedtime.       . meloxicam (MOBIC) 15 MG tablet Take 15 mg by mouth daily.      . Multiple Vitamin (MULTIVITAMIN) tablet Take 1 tablet by mouth daily.      . Omega-3 Fatty Acids (FISH OIL) 1200 MG CAPS Take 1 capsule by mouth daily.      . rosuvastatin (CRESTOR) 40 MG tablet Take 40 mg by mouth at bedtime.      . traMADol (ULTRAM) 50 MG tablet Take 50 mg by mouth every 6 (six) hours as needed for moderate pain.       No current facility-administered medications for this visit.    Socially she is married her she is usually from the [place] area. She has 2 children. His remote tobacco history but she quit in 2000.  ROS is negative for fevers, chills or night sweats. She denies skin rash. She states her vision in her right eye is improving. She denies shortness of breath. She denies chest pressure. There is no PND or orthopnea. He denies palpitations the She thinks her blood pressure has been more stable with resumption of medical therapy. She denies bleeding. She denies abdominal pain, nausea vomiting or diarrhea. There is no blood in stool or urine. She denies myalgias. She has pain in her right knee which has limited her mobility resulting in increased weight gain due to reduced exercise. She denies significant edema. She denies overt diabetes. She denies thyroid abnormalities or other endocrine disorder. She denies significant sleep issues. Other comprehensive 14 point system review is negative. PE BP  118/80  Ht 5\' 5"  (1.651 m)  Wt 196 lb 3.2 oz (88.996 kg)  BMI 32.65 kg/m2  General: Alert, oriented, no distress.  Skin: normal turgor, no rashes HEENT: Normocephalic, atraumatic. Pupils round and reactive; sclera anicteric;no lid lag.  Nose without nasal septal hypertrophy Mouth/Parynx benign; Mallinpatti scale 2 Neck: No JVD, no carotid briuts Chest: No tenderness to palpate Lungs: clear to ausculatation and percussion; no wheezing or rales Heart: RRR, s1 s2 normal 1-2/6 systolic murmur. No S3 or S4 gallop. Abdomen: soft, nontender; no hepatosplenomehaly, BS+; abdominal aorta nontender and not dilated by palpation. Back: No CVA tenderness Pulses 2+ Extremities: no clubbing cyanosis or edema, Homan's sign negative  Neurologic: grossly  nonfocal Psychological: Normal affect and mood and cognitive function.  EKG today shows normal sinus rhythm at 82 beats per minute. Nonspecific ST changes. Normal intervals.  LABS:  BMET    Component Value Date/Time   NA 134* 06/30/2013 1330   K 4.3 06/30/2013 1330   CL 97 06/30/2013 1330   CO2 27 06/30/2013 1330   GLUCOSE 83 06/30/2013 1330   BUN 14 06/30/2013 1330   CREATININE 0.62 06/30/2013 1330   CREATININE 0.63 06/28/2013 0837   CALCIUM 9.7 06/30/2013 1330   GFRNONAA >90 06/30/2013 1330   GFRAA >90 06/30/2013 1330     Hepatic Function Panel     Component Value Date/Time   PROT 7.4 06/30/2013 1330   ALBUMIN 3.9 06/30/2013 1330   AST 45* 06/30/2013 1330   ALT 37* 06/30/2013 1330   ALKPHOS 111 06/30/2013 1330   BILITOT 0.5 06/30/2013 1330     CBC    Component Value Date/Time   WBC 11.8* 06/30/2013 1330   RBC 4.81 06/30/2013 1330   HGB 13.2 06/30/2013 1330   HCT 40.1 06/30/2013 1330   PLT 384 06/30/2013 1330   MCV 83.4 06/30/2013 1330   MCH 27.4 06/30/2013 1330   MCHC 32.9 06/30/2013 1330   RDW 12.9 06/30/2013 1330     BNP No results found for this basename: probnp    Lipid Panel     Component Value Date/Time     CHOL 175 06/28/2013 0839   TRIG 226* 06/28/2013 0839   HDL 49 06/28/2013 0839   CHOLHDL 3.6 06/28/2013 0839   VLDL 45* 06/28/2013 0839   LDLCALC 81 06/28/2013 0839     RADIOLOGY: No results found.   ASSESSMENT AND PLAN: Ms. Janco continues to do well on medical therapy both for hypertension as well as hyperlipidemia. Her EKG continues to be stable. A nuclear study was low risk done earlier this year. Her blood pressure today is well controlled on losartan 50 mg daily. We did discuss improvement in diet and exercise following rehabilitation for her knee. Presently, giving her clearance to undergo her right knee replacement surgery to be in 2 weeks by Dr. Berton Lan. I'll see her in 3 months for followup valuation further recommendations will be made at that time.   Lennette Bihari, MD, [facility]  06/30/2013 9:02 PM

## 2013-06-30 NOTE — Patient Instructions (Signed)
Bianca Ferguson  06/30/2013                           YOUR PROCEDURE IS SCHEDULED ON: 07/11/13               PLEASE REPORT TO SHORT STAY CENTER AT : 6:30 AM               CALL THIS NUMBER IF ANY PROBLEMS THE DAY OF SURGERY :               [phone]                      REMEMBER:   Do not eat food or drink liquids AFTER MIDNIGHT   Take these medicines the morning of surgery with A SIP OF WATER: BUSPAR / CYMBALTA / MAY TAKE TRAMADOL-BENADRYL IF NEEDED   Do not wear jewelry, make-up   Do not wear lotions, powders, or perfumes.   Do not shave legs or underarms 12 hrs. before surgery (men may shave face)  Do not bring valuables to the hospital.  Contacts, dentures or bridgework may not be worn into surgery.  Leave suitcase in the car. After surgery it may be brought to your room.  For patients admitted to the hospital more than one night, checkout time is 11:00                          The day of discharge.   Patients discharged the day of surgery will not be allowed to drive home                             If going home same day of surgery, must have someone stay with you first                           24 hrs at home and arrange for some one to drive you home from hospital.    Special Instructions:   Please read over the following fact sheets that you were given:               1. MRSA  INFORMATION                      2. [place] PREPARING FOR SURGERY SHEET              3. INCENTIVE SPIROMETER                                                X_____________________________________________________________________        Failure to follow these instructions may result in cancellation of your surgery

## 2013-07-01 ENCOUNTER — Encounter (INDEPENDENT_AMBULATORY_CARE_PROVIDER_SITE_OTHER): Payer: PRIVATE HEALTH INSURANCE | Admitting: Ophthalmology

## 2013-07-01 ENCOUNTER — Encounter: Payer: Self-pay | Admitting: Cardiovascular Disease

## 2013-07-01 DIAGNOSIS — H25049 Posterior subcapsular polar age-related cataract, unspecified eye: Secondary | ICD-10-CM

## 2013-07-01 DIAGNOSIS — H35039 Hypertensive retinopathy, unspecified eye: Secondary | ICD-10-CM

## 2013-07-01 DIAGNOSIS — I10 Essential (primary) hypertension: Secondary | ICD-10-CM

## 2013-07-01 DIAGNOSIS — H43819 Vitreous degeneration, unspecified eye: Secondary | ICD-10-CM

## 2013-07-01 DIAGNOSIS — H348192 Central retinal vein occlusion, unspecified eye, stable: Secondary | ICD-10-CM

## 2013-07-10 ENCOUNTER — Other Ambulatory Visit: Payer: Self-pay | Admitting: Orthopedic Surgery

## 2013-07-10 NOTE — H&P (Signed)
Bianca Ferguson  DOB: 08/09/1950 Married / Language: English / Race: White Female  Date of Admission:  07-11-2013  Chief Complaint:  Right Knee Pain  History of Present Illness The patient is a 62 year old female who comes in for a preoperative History and Physical. The patient is scheduled for a right total knee arthroplasty to be performed by Dr. Frank V. Aluisio, MD at [facility] on 07-11-2013. The patient is a 62 year old female who presents for follow up of their knee. The patient is being followed for their right knee pain and osteoarthritis. Symptoms reported today include: pain, aching and difficulty ambulating. The patient feels that they are doing poorly and report their pain level to be mild to moderate and severe. The following medication has been used for pain control: Ultram. Note for "Follow-up Knee": She is scheduled for surgery on 07/11/13. She states she is not sure how she is going to make it that far. She is ready to proceed with the knee replacement. They have been treated conservatively in the past for the above stated problem and despite conservative measures, they continue to have progressive pain and severe functional limitations and dysfunction. They have failed non-operative management including home exercise, medications, surgery and injections. It is felt that they would benefit from undergoing total joint replacement. Risks and benefits of the procedure have been discussed with the patient and they elect to proceed with surgery. There are no active contraindications to surgery such as ongoing infection or rapidly progressive neurological disease.   Problem List/Past Medical History S/P arthroscopy of right knee (V45.89) Primary osteoarthritis of one knee (715.16) Diverticulitis Of Colon Anxiety Disorder Hypertension Impaired Vision. Right Eye Central Retinal Vein Occlusion Contact dermatitis, allergic Gastroesophageal Reflux  Disease Fibrocystic Disease Of Breast Glaucoma Hemorrhoids Hyperlipidemia Insomnia Mulitple Benign Nevi Obesity Seborrheic Keratosis Syringoma     Allergies No Known Drug Allergies. 12/25/2011    Family History Heart Disease. mother, father and grandmother fathers side Heart disease in female family member before age 55 Kidney disease. father Cancer. grandfather mothers side and grandmother fathers side Chronic Obstructive Lung Disease. mother Congestive Heart Failure. father and grandmother fathers side Osteoarthritis. mother Hypertension. First Degree Relatives. father Siblings. Brother - AIDS    Social History Exercise. Exercises weekly; does running / walking Drug/Alcohol Rehab (Previously). no Living situation. live with spouse Illicit drug use. no Children. 2 Alcohol use. current drinker; drinks wine; only occasionally per week Drug/Alcohol Rehab (Currently). no Current work status. retired Marital status. married Tobacco / smoke exposure. no Pain Contract. no Tobacco use. Former smoker. former smoker; smoke(d) 1 pack(s) per day Post-Surgical Plans. Home    Medication History Meloxicam (15MG Tablet, 1 (one) Tablet Oral daily, Taken starting 04/21/2013) Active. Crestor (5MG Tablet, Oral) Active. Losartan Potassium (50MG Tablet, Oral) Active. Cymbalta (60MG Capsule DR Part, Oral) Active. Aspirin (81MG Tablet, 1 (one) Oral) Active.    Spinal Surgery. Date: 2008. Breast Mass; Local Excision. bilateral Hysterectomy. Date: 2002. complete (non-cancerous) Gallbladder Surgery. Date: 1980. open Appendectomy. Date: 1967. Arthroscopic Knee Surgery - Right. 2013 and 2014   Review of Systems General:Not Present- Chills, Fever, Night Sweats, Fatigue, Weight Gain, Weight Loss and Memory Loss. Skin:Not Present- Hives, Itching, Rash, Eczema and Lesions. HEENT:Not Present- Tinnitus, Headache, Double Vision, Visual Loss,  Hearing Loss and Dentures. Respiratory:Not Present- Shortness of breath with exertion, Shortness of breath at rest, Allergies, Coughing up blood and Chronic Cough. Cardiovascular:Not Present- Chest Pain, Racing/skipping heartbeats, Difficulty Breathing Lying Down, Murmur, Swelling   and Palpitations. Gastrointestinal:Not Present- Bloody Stool, Heartburn, Abdominal Pain, Vomiting, Nausea, Constipation, Diarrhea, Difficulty Swallowing, Jaundice and Loss of appetitie. Female Genitourinary:Not Present- Blood in Urine, Urinary frequency, Weak urinary stream, Discharge, Flank Pain, Incontinence, Painful Urination, Urgency, Urinary Retention and Urinating at Night. Musculoskeletal:Present- Joint Pain. Not Present- Muscle Weakness, Muscle Pain, Joint Swelling, Back Pain, Morning Stiffness and Spasms. Neurological:Not Present- Tremor, Dizziness, Blackout spells, Paralysis, Difficulty with balance and Weakness. Psychiatric:Not Present- Insomnia.    Vitals  Height: 65 in Height was reported by patient. Pulse: 76 (Regular) Resp.: 14 (Unlabored) BP: 128/82 (Sitting, Right Arm, Standard)     Physical Exam  The physical exam findings are as follows:   General Mental Status - Alert, cooperative and good historian. General Appearance- pleasant. Not in acute distress. Orientation- Oriented X3. Build & Nutrition- Well nourished and Well developed.   Head and Neck Head- normocephalic, atraumatic . Neck Global Assessment- supple. no bruit auscultated on the right and no bruit auscultated on the left.   Eye Vision- Wears corrective lenses (readers). Pupil- Bilateral- Regular and Round. Motion- Bilateral- EOMI.   Chest and Lung Exam Auscultation: Breath sounds:- clear at anterior chest wall and - clear at posterior chest wall. Adventitious sounds:- No Adventitious sounds.   Cardiovascular Auscultation:Rhythm- Regular rate and rhythm. Heart Sounds- S1  WNL and S2 WNL. Murmurs & Other Heart Sounds:Auscultation of the heart reveals - No Murmurs.   Abdomen Palpation/Percussion:Tenderness- Abdomen is non-tender to palpation. Rigidity (guarding)- Abdomen is soft. Auscultation:Auscultation of the abdomen reveals - Bowel sounds normal.   Female Genitourinary  Not done, not pertinent to present illness  Musculoskeletal  On exam she is alert and oriented. She is in no acute distress. The right knee is tender medially greater than laterally. There is no effusion noted but she does have moderate soft tissue swelling. She does have pain with passive and active range of motion. Calves are soft, nontender. Distal pulses are 2+. Sensation and motor function is intact in the lower extremities.   Assessment & Plan Primary osteoarthritis of one knee (715.16) Impression: Right Knee  Note: Plan is for a Right Total Knee Replacement by Dr. Aluisio.  Plan is to go home  PCP - Dr. Jobe - Patient has been seen preoperatively and felt to be stable for surgery. Cardiology - Dr. Kelly - Patient has been seen preoperatively and felt to be stable for surgery.  The patient does not have any contraindications and will receive TXA (tranexamic acid) prior to surgery.  Signed electronically by Aidaly Cordner L Farrah Skoda, III PA-C   

## 2013-07-11 ENCOUNTER — Encounter (HOSPITAL_COMMUNITY): Payer: PRIVATE HEALTH INSURANCE | Admitting: Certified Registered Nurse Anesthetist

## 2013-07-11 ENCOUNTER — Inpatient Hospital Stay (HOSPITAL_COMMUNITY)
Admission: RE | Admit: 2013-07-11 | Discharge: 2013-07-13 | DRG: 470 | Disposition: A | Discharge status: Home Health | Payer: PRIVATE HEALTH INSURANCE | Source: Ambulatory Visit | Attending: Orthopedic Surgery | Admitting: Orthopedic Surgery

## 2013-07-11 ENCOUNTER — Encounter (HOSPITAL_COMMUNITY): Payer: Self-pay

## 2013-07-11 ENCOUNTER — Encounter (HOSPITAL_COMMUNITY)
Admission: RE | Disposition: A | Discharge status: Home Health | Payer: Self-pay | Source: Ambulatory Visit | Attending: Orthopedic Surgery

## 2013-07-11 ENCOUNTER — Inpatient Hospital Stay (HOSPITAL_COMMUNITY): Payer: PRIVATE HEALTH INSURANCE | Admitting: Certified Registered Nurse Anesthetist

## 2013-07-11 DIAGNOSIS — M1710 Unilateral primary osteoarthritis, unspecified knee: Principal | ICD-10-CM | POA: Diagnosis present

## 2013-07-11 DIAGNOSIS — M179 Osteoarthritis of knee, unspecified: Secondary | ICD-10-CM | POA: Diagnosis present

## 2013-07-11 DIAGNOSIS — Z96651 Presence of right artificial knee joint: Secondary | ICD-10-CM

## 2013-07-11 DIAGNOSIS — H547 Unspecified visual loss: Secondary | ICD-10-CM | POA: Diagnosis present

## 2013-07-11 DIAGNOSIS — Z87891 Personal history of nicotine dependence: Secondary | ICD-10-CM

## 2013-07-11 DIAGNOSIS — F411 Generalized anxiety disorder: Secondary | ICD-10-CM | POA: Diagnosis present

## 2013-07-11 DIAGNOSIS — E669 Obesity, unspecified: Secondary | ICD-10-CM | POA: Diagnosis present

## 2013-07-11 DIAGNOSIS — H409 Unspecified glaucoma: Secondary | ICD-10-CM | POA: Diagnosis present

## 2013-07-11 DIAGNOSIS — Z6833 Body mass index (BMI) 33.0-33.9, adult: Secondary | ICD-10-CM

## 2013-07-11 DIAGNOSIS — F909 Attention-deficit hyperactivity disorder, unspecified type: Secondary | ICD-10-CM | POA: Diagnosis present

## 2013-07-11 DIAGNOSIS — Z8249 Family history of ischemic heart disease and other diseases of the circulatory system: Secondary | ICD-10-CM

## 2013-07-11 DIAGNOSIS — E785 Hyperlipidemia, unspecified: Secondary | ICD-10-CM | POA: Diagnosis present

## 2013-07-11 DIAGNOSIS — D62 Acute posthemorrhagic anemia: Secondary | ICD-10-CM | POA: Diagnosis not present

## 2013-07-11 DIAGNOSIS — K219 Gastro-esophageal reflux disease without esophagitis: Secondary | ICD-10-CM | POA: Diagnosis present

## 2013-07-11 HISTORY — PX: TOTAL KNEE ARTHROPLASTY: SHX125

## 2013-07-11 SURGERY — ARTHROPLASTY, KNEE, TOTAL
Anesthesia: Spinal | Site: Knee | Laterality: Right

## 2013-07-11 MED ORDER — 0.9 % SODIUM CHLORIDE (POUR BTL) OPTIME
TOPICAL | Status: DC | PRN
  Administered 2013-07-11: 1000 mL

## 2013-07-11 MED ORDER — MENTHOL 3 MG MT LOZG
1.0000 | LOZENGE | OROMUCOSAL | Status: DC | PRN
  Filled 2013-07-11: qty 9

## 2013-07-11 MED ORDER — SODIUM CHLORIDE 0.9 % IJ SOLN
INTRAMUSCULAR | Status: DC | PRN
  Administered 2013-07-11: 10:00:00

## 2013-07-11 MED ORDER — RIVAROXABAN 10 MG PO TABS
10.0000 mg | ORAL_TABLET | Freq: Every day | ORAL | Status: DC
Start: 2013-07-12 — End: 2013-07-13
  Administered 2013-07-12 – 2013-07-13 (×2): 10 mg via ORAL
  Filled 2013-07-11 (×3): qty 1

## 2013-07-11 MED ORDER — PROMETHAZINE HCL 25 MG/ML IJ SOLN: 6.2500 mg | INTRAMUSCULAR | Status: DC | PRN

## 2013-07-11 MED ORDER — DIPHENHYDRAMINE HCL 50 MG/ML IJ SOLN
INTRAMUSCULAR | Status: DC | PRN
  Administered 2013-07-11: 12.5 mg via INTRAVENOUS

## 2013-07-11 MED ORDER — ROCURONIUM BROMIDE 100 MG/10ML IV SOLN
INTRAVENOUS | Status: AC
  Filled 2013-07-11: qty 1

## 2013-07-11 MED ORDER — LIDOCAINE HCL (CARDIAC) 20 MG/ML IV SOLN
INTRAVENOUS | Status: AC
  Filled 2013-07-11: qty 5

## 2013-07-11 MED ORDER — BUPIVACAINE HCL (PF) 0.75 % IJ SOLN
INTRAMUSCULAR | Status: DC | PRN
  Administered 2013-07-11: 2 mL

## 2013-07-11 MED ORDER — CEFAZOLIN SODIUM-DEXTROSE 2-3 GM-% IV SOLR
INTRAVENOUS | Status: AC
  Filled 2013-07-11: qty 50

## 2013-07-11 MED ORDER — BUPIVACAINE HCL 0.25 % IJ SOLN
INTRAMUSCULAR | Status: DC | PRN
  Administered 2013-07-11: 20 mL

## 2013-07-11 MED ORDER — TRANEXAMIC ACID 100 MG/ML IV SOLN
1000.0000 mg | INTRAVENOUS | Status: AC
  Administered 2013-07-11: 1000 mg via INTRAVENOUS
  Filled 2013-07-11: qty 10

## 2013-07-11 MED ORDER — DIPHENHYDRAMINE HCL 50 MG/ML IJ SOLN
INTRAMUSCULAR | Status: AC
  Filled 2013-07-11: qty 1

## 2013-07-11 MED ORDER — CHLORHEXIDINE GLUCONATE 4 % EX LIQD: 60.0000 mL | Freq: Once | CUTANEOUS | Status: DC

## 2013-07-11 MED ORDER — MIDAZOLAM HCL 2 MG/2ML IJ SOLN
INTRAMUSCULAR | Status: AC
  Filled 2013-07-11: qty 2

## 2013-07-11 MED ORDER — LOSARTAN POTASSIUM 50 MG PO TABS
50.0000 mg | ORAL_TABLET | Freq: Every day | ORAL | Status: DC
  Administered 2013-07-11 – 2013-07-12 (×2): 50 mg via ORAL
  Filled 2013-07-11 (×3): qty 1

## 2013-07-11 MED ORDER — PHENOL 1.4 % MT LIQD: 1.0000 | OROMUCOSAL | Status: DC | PRN

## 2013-07-11 MED ORDER — PROPOFOL 10 MG/ML IV BOLUS
INTRAVENOUS | Status: AC
  Filled 2013-07-11: qty 20

## 2013-07-11 MED ORDER — FLEET ENEMA 7-19 GM/118ML RE ENEM: 1.0000 | ENEMA | Freq: Once | RECTAL | Status: AC | PRN

## 2013-07-11 MED ORDER — CEFAZOLIN SODIUM 1-5 GM-% IV SOLN
1.0000 g | Freq: Four times a day (QID) | INTRAVENOUS | Status: AC
  Administered 2013-07-11 (×2): 1 g via INTRAVENOUS
  Filled 2013-07-11 (×2): qty 50

## 2013-07-11 MED ORDER — BUSPIRONE HCL 5 MG PO TABS
5.0000 mg | ORAL_TABLET | Freq: Every day | ORAL | Status: DC
  Administered 2013-07-13: 5 mg via ORAL
  Filled 2013-07-11 (×2): qty 1

## 2013-07-11 MED ORDER — DULOXETINE HCL 60 MG PO CPEP
60.0000 mg | ORAL_CAPSULE | Freq: Every day | ORAL | Status: DC
  Administered 2013-07-11 – 2013-07-12 (×2): 60 mg via ORAL
  Filled 2013-07-11 (×3): qty 1

## 2013-07-11 MED ORDER — BISACODYL 10 MG RE SUPP: 10.0000 mg | Freq: Every day | RECTAL | Status: DC | PRN

## 2013-07-11 MED ORDER — METHOCARBAMOL 100 MG/ML IJ SOLN
500.0000 mg | Freq: Four times a day (QID) | INTRAVENOUS | Status: DC | PRN
  Administered 2013-07-11: 500 mg via INTRAVENOUS
  Filled 2013-07-11 (×2): qty 5

## 2013-07-11 MED ORDER — DOCUSATE SODIUM 100 MG PO CAPS
100.0000 mg | ORAL_CAPSULE | Freq: Two times a day (BID) | ORAL | Status: DC
  Administered 2013-07-11 – 2013-07-13 (×4): 100 mg via ORAL

## 2013-07-11 MED ORDER — BUPIVACAINE LIPOSOME 1.3 % IJ SUSP
20.0000 mL | Freq: Once | INTRAMUSCULAR | Status: DC
  Filled 2013-07-11: qty 20

## 2013-07-11 MED ORDER — SODIUM CHLORIDE 0.9 % IV SOLN: INTRAVENOUS | Status: DC

## 2013-07-11 MED ORDER — PROPOFOL INFUSION 10 MG/ML OPTIME
INTRAVENOUS | Status: DC | PRN
  Administered 2013-07-11: 50 ug/kg/min via INTRAVENOUS

## 2013-07-11 MED ORDER — ACETAMINOPHEN 500 MG PO TABS
1000.0000 mg | ORAL_TABLET | Freq: Once | ORAL | Status: AC
  Administered 2013-07-11: 1000 mg via ORAL
  Filled 2013-07-11: qty 2

## 2013-07-11 MED ORDER — GLYCOPYRROLATE 0.2 MG/ML IJ SOLN
INTRAMUSCULAR | Status: AC
  Filled 2013-07-11: qty 3

## 2013-07-11 MED ORDER — SODIUM CHLORIDE 0.9 % IR SOLN
Status: DC | PRN
  Administered 2013-07-11: 1000 mL

## 2013-07-11 MED ORDER — HYDROMORPHONE HCL PF 1 MG/ML IJ SOLN
INTRAMUSCULAR | Status: AC
  Filled 2013-07-11: qty 1

## 2013-07-11 MED ORDER — DEXAMETHASONE 6 MG PO TABS
10.0000 mg | ORAL_TABLET | Freq: Every day | ORAL | Status: AC
  Administered 2013-07-12: 10 mg via ORAL
  Filled 2013-07-11: qty 1

## 2013-07-11 MED ORDER — DEXTROSE-NACL 5-0.9 % IV SOLN
INTRAVENOUS | Status: DC
  Administered 2013-07-11 – 2013-07-12 (×2): via INTRAVENOUS

## 2013-07-11 MED ORDER — METOCLOPRAMIDE HCL 10 MG PO TABS: 5.0000 mg | ORAL_TABLET | Freq: Three times a day (TID) | ORAL | Status: DC | PRN

## 2013-07-11 MED ORDER — METHOCARBAMOL 500 MG PO TABS
500.0000 mg | ORAL_TABLET | Freq: Four times a day (QID) | ORAL | Status: DC | PRN
  Administered 2013-07-11 – 2013-07-13 (×6): 500 mg via ORAL
  Filled 2013-07-11 (×8): qty 1

## 2013-07-11 MED ORDER — LACTATED RINGERS IV SOLN
INTRAVENOUS | Status: DC
  Administered 2013-07-11: 1000 mL via INTRAVENOUS
  Administered 2013-07-11: 10:00:00 via INTRAVENOUS

## 2013-07-11 MED ORDER — METOCLOPRAMIDE HCL 5 MG/ML IJ SOLN: 5.0000 mg | Freq: Three times a day (TID) | INTRAMUSCULAR | Status: DC | PRN

## 2013-07-11 MED ORDER — HYDROMORPHONE HCL 2 MG PO TABS
2.0000 mg | ORAL_TABLET | ORAL | Status: DC | PRN
  Administered 2013-07-11 (×3): 2 mg via ORAL
  Administered 2013-07-12 – 2013-07-13 (×10): 4 mg via ORAL
  Filled 2013-07-11 (×2): qty 2
  Filled 2013-07-11: qty 1
  Filled 2013-07-11: qty 2
  Filled 2013-07-11: qty 1
  Filled 2013-07-11 (×5): qty 2
  Filled 2013-07-11: qty 1
  Filled 2013-07-11 (×2): qty 2

## 2013-07-11 MED ORDER — ONDANSETRON HCL 4 MG/2ML IJ SOLN
INTRAMUSCULAR | Status: AC
  Filled 2013-07-11: qty 2

## 2013-07-11 MED ORDER — PROPOFOL 10 MG/ML IV BOLUS
INTRAVENOUS | Status: DC | PRN
  Administered 2013-07-11: 25 mg via INTRAVENOUS

## 2013-07-11 MED ORDER — HYDROMORPHONE HCL PF 1 MG/ML IJ SOLN
0.2500 mg | INTRAMUSCULAR | Status: DC | PRN
  Administered 2013-07-11 (×2): 0.5 mg via INTRAVENOUS

## 2013-07-11 MED ORDER — ONDANSETRON HCL 4 MG/2ML IJ SOLN
INTRAMUSCULAR | Status: DC | PRN
  Administered 2013-07-11: 4 mg via INTRAVENOUS

## 2013-07-11 MED ORDER — PHENYLEPHRINE HCL 10 MG/ML IJ SOLN
INTRAMUSCULAR | Status: DC | PRN
  Administered 2013-07-11: 40 ug via INTRAVENOUS
  Administered 2013-07-11: 80 ug via INTRAVENOUS

## 2013-07-11 MED ORDER — FLUTICASONE PROPIONATE 50 MCG/ACT NA SUSP
1.0000 | Freq: Every day | NASAL | Status: DC
Start: 2013-07-11 — End: 2013-07-13
  Administered 2013-07-11 – 2013-07-12 (×2): 1 via NASAL
  Filled 2013-07-11: qty 16

## 2013-07-11 MED ORDER — DIPHENHYDRAMINE HCL 12.5 MG/5ML PO ELIX
12.5000 mg | ORAL_SOLUTION | ORAL | Status: DC | PRN
  Administered 2013-07-11 (×2): 12.5 mg via ORAL
  Administered 2013-07-12 (×2): 25 mg via ORAL
  Administered 2013-07-13: 12.5 mg via ORAL
  Administered 2013-07-13 (×2): 25 mg via ORAL
  Filled 2013-07-11: qty 5
  Filled 2013-07-11: qty 10
  Filled 2013-07-11: qty 5
  Filled 2013-07-11: qty 10
  Filled 2013-07-11: qty 5
  Filled 2013-07-11: qty 10
  Filled 2013-07-11: qty 5
  Filled 2013-07-11: qty 10

## 2013-07-11 MED ORDER — SODIUM CHLORIDE 0.9 % IJ SOLN
INTRAMUSCULAR | Status: AC
  Filled 2013-07-11: qty 10

## 2013-07-11 MED ORDER — FENTANYL CITRATE 0.05 MG/ML IJ SOLN
INTRAMUSCULAR | Status: AC
  Filled 2013-07-11: qty 5

## 2013-07-11 MED ORDER — PHENYLEPHRINE 40 MCG/ML (10ML) SYRINGE FOR IV PUSH (FOR BLOOD PRESSURE SUPPORT)
PREFILLED_SYRINGE | INTRAVENOUS | Status: AC
  Filled 2013-07-11: qty 10

## 2013-07-11 MED ORDER — SODIUM CHLORIDE 0.9 % IJ SOLN
INTRAMUSCULAR | Status: AC
  Filled 2013-07-11: qty 50

## 2013-07-11 MED ORDER — DEXAMETHASONE SODIUM PHOSPHATE 10 MG/ML IJ SOLN
10.0000 mg | Freq: Every day | INTRAMUSCULAR | Status: AC
  Filled 2013-07-11: qty 1

## 2013-07-11 MED ORDER — HYDROMORPHONE HCL PF 1 MG/ML IJ SOLN
0.5000 mg | INTRAMUSCULAR | Status: DC | PRN
  Administered 2013-07-11 – 2013-07-13 (×5): 1 mg via INTRAVENOUS
  Filled 2013-07-11 (×5): qty 1

## 2013-07-11 MED ORDER — KETOROLAC TROMETHAMINE 15 MG/ML IJ SOLN
7.5000 mg | Freq: Four times a day (QID) | INTRAMUSCULAR | Status: AC | PRN
  Administered 2013-07-11 – 2013-07-12 (×2): 7.5 mg via INTRAVENOUS
  Filled 2013-07-11 (×2): qty 1

## 2013-07-11 MED ORDER — POLYETHYLENE GLYCOL 3350 17 G PO PACK: 17.0000 g | PACK | Freq: Every day | ORAL | Status: DC | PRN

## 2013-07-11 MED ORDER — MIDAZOLAM HCL 5 MG/5ML IJ SOLN
INTRAMUSCULAR | Status: DC | PRN
  Administered 2013-07-11: 2 mg via INTRAVENOUS

## 2013-07-11 MED ORDER — BUPIVACAINE IN DEXTROSE 0.75-8.25 % IT SOLN
INTRATHECAL | Status: DC | PRN
  Administered 2013-07-11: 2 mL via INTRATHECAL

## 2013-07-11 MED ORDER — DIPHENHYDRAMINE HCL 25 MG PO CAPS
50.0000 mg | ORAL_CAPSULE | Freq: Every evening | ORAL | Status: DC | PRN
  Administered 2013-07-11: 50 mg via ORAL
  Filled 2013-07-11: qty 2

## 2013-07-11 MED ORDER — CEFAZOLIN SODIUM-DEXTROSE 2-3 GM-% IV SOLR
2.0000 g | INTRAVENOUS | Status: AC
  Administered 2013-07-11: 2 g via INTRAVENOUS

## 2013-07-11 MED ORDER — FENTANYL CITRATE 0.05 MG/ML IJ SOLN
INTRAMUSCULAR | Status: DC | PRN
  Administered 2013-07-11: 50 ug via INTRAVENOUS

## 2013-07-11 MED ORDER — ONDANSETRON HCL 4 MG/2ML IJ SOLN: 4.0000 mg | Freq: Four times a day (QID) | INTRAMUSCULAR | Status: DC | PRN

## 2013-07-11 MED ORDER — SUCCINYLCHOLINE CHLORIDE 20 MG/ML IJ SOLN
INTRAMUSCULAR | Status: AC
  Filled 2013-07-11: qty 1

## 2013-07-11 MED ORDER — MEPERIDINE HCL 50 MG/ML IJ SOLN: 6.2500 mg | INTRAMUSCULAR | Status: DC | PRN

## 2013-07-11 MED ORDER — ONDANSETRON HCL 4 MG PO TABS: 4.0000 mg | ORAL_TABLET | Freq: Four times a day (QID) | ORAL | Status: DC | PRN

## 2013-07-11 MED ORDER — BUPIVACAINE HCL (PF) 0.25 % IJ SOLN
INTRAMUSCULAR | Status: AC
  Filled 2013-07-11: qty 30

## 2013-07-11 MED ORDER — NEOSTIGMINE METHYLSULFATE 1 MG/ML IJ SOLN
INTRAMUSCULAR | Status: AC
  Filled 2013-07-11: qty 10

## 2013-07-11 MED ORDER — DEXAMETHASONE SODIUM PHOSPHATE 10 MG/ML IJ SOLN
10.0000 mg | Freq: Once | INTRAMUSCULAR | Status: AC
  Administered 2013-07-11: 10 mg via INTRAVENOUS

## 2013-07-11 SURGICAL SUPPLY — 57 items
BAG ZIPLOCK 12X15 (MISCELLANEOUS) ×2 IMPLANT
BANDAGE ELASTIC 6 VELCRO ST LF (GAUZE/BANDAGES/DRESSINGS) ×2 IMPLANT
BANDAGE ESMARK 6X9 LF (GAUZE/BANDAGES/DRESSINGS) ×1 IMPLANT
BLADE SAG 18X100X1.27 (BLADE) ×2 IMPLANT
BLADE SAW SGTL 11.0X1.19X90.0M (BLADE) ×2 IMPLANT
BNDG ESMARK 6X9 LF (GAUZE/BANDAGES/DRESSINGS) ×2
BOWL SMART MIX CTS (DISPOSABLE) ×2 IMPLANT
CAP KNEE ATTUNE RP ×2 IMPLANT
CEMENT HV SMART SET (Cement) ×2 IMPLANT
CUFF TOURN SGL QUICK 34 (TOURNIQUET CUFF) ×1
CUFF TRNQT CYL 34X4X40X1 (TOURNIQUET CUFF) ×1 IMPLANT
DECANTER SPIKE VIAL GLASS SM (MISCELLANEOUS) ×2 IMPLANT
DRAPE EXTREMITY T 121X128X90 (DRAPE) ×2 IMPLANT
DRAPE POUCH INSTRU U-SHP 10X18 (DRAPES) ×2 IMPLANT
DRAPE U-SHAPE 47X51 STRL (DRAPES) ×2 IMPLANT
DRSG ADAPTIC 3X8 NADH LF (GAUZE/BANDAGES/DRESSINGS) ×2 IMPLANT
DRSG PAD ABDOMINAL 8X10 ST (GAUZE/BANDAGES/DRESSINGS) ×2 IMPLANT
DURAPREP 26ML APPLICATOR (WOUND CARE) ×2 IMPLANT
ELECT REM PT RETURN 9FT ADLT (ELECTROSURGICAL) ×2
ELECTRODE REM PT RTRN 9FT ADLT (ELECTROSURGICAL) ×1 IMPLANT
EVACUATOR 1/8 PVC DRAIN (DRAIN) ×2 IMPLANT
FACESHIELD LNG OPTICON STERILE (SAFETY) ×10 IMPLANT
GLOVE BIO SURGEON STRL SZ7.5 (GLOVE) IMPLANT
GLOVE BIO SURGEON STRL SZ8 (GLOVE) ×2 IMPLANT
GLOVE BIOGEL PI IND STRL 8 (GLOVE) ×2 IMPLANT
GLOVE BIOGEL PI INDICATOR 8 (GLOVE) ×2
GLOVE SURG SS PI 6.5 STRL IVOR (GLOVE) IMPLANT
GOWN PREVENTION PLUS LG XLONG (DISPOSABLE) ×2 IMPLANT
GOWN STRL REIN XL XLG (GOWN DISPOSABLE) IMPLANT
HANDPIECE INTERPULSE COAX TIP (DISPOSABLE) ×1
IMMOBILIZER KNEE 20 (SOFTGOODS) ×2
IMMOBILIZER KNEE 20 THIGH 36 (SOFTGOODS) ×1 IMPLANT
KIT BASIN OR (CUSTOM PROCEDURE TRAY) ×2 IMPLANT
MANIFOLD NEPTUNE II (INSTRUMENTS) ×2 IMPLANT
NDL SAFETY ECLIPSE 18X1.5 (NEEDLE) ×2 IMPLANT
NEEDLE HYPO 18GX1.5 SHARP (NEEDLE) ×2
NS IRRIG 1000ML POUR BTL (IV SOLUTION) ×2 IMPLANT
PACK TOTAL JOINT (CUSTOM PROCEDURE TRAY) ×2 IMPLANT
PAD ABD 8X10 STRL (GAUZE/BANDAGES/DRESSINGS) ×2 IMPLANT
PADDING CAST ABS 6INX4YD NS (CAST SUPPLIES) ×1
PADDING CAST ABS COTTON 6X4 NS (CAST SUPPLIES) ×1 IMPLANT
PADDING CAST COTTON 6X4 STRL (CAST SUPPLIES) ×6 IMPLANT
POSITIONER SURGICAL ARM (MISCELLANEOUS) ×2 IMPLANT
SET HNDPC FAN SPRY TIP SCT (DISPOSABLE) ×1 IMPLANT
SPONGE GAUZE 4X4 12PLY (GAUZE/BANDAGES/DRESSINGS) ×2 IMPLANT
STRIP CLOSURE SKIN 1/2X4 (GAUZE/BANDAGES/DRESSINGS) ×2 IMPLANT
SUCTION FRAZIER 12FR DISP (SUCTIONS) ×2 IMPLANT
SUT MNCRL AB 4-0 PS2 18 (SUTURE) ×2 IMPLANT
SUT VIC AB 2-0 CT1 27 (SUTURE) ×3
SUT VIC AB 2-0 CT1 TAPERPNT 27 (SUTURE) ×3 IMPLANT
SUT VLOC 180 0 24IN GS25 (SUTURE) ×2 IMPLANT
SYR 20CC LL (SYRINGE) ×2 IMPLANT
SYR 50ML LL SCALE MARK (SYRINGE) ×2 IMPLANT
TOWEL OR 17X26 10 PK STRL BLUE (TOWEL DISPOSABLE) ×4 IMPLANT
TRAY FOLEY CATH 14FRSI W/METER (CATHETERS) ×2 IMPLANT
WATER STERILE IRR 1500ML POUR (IV SOLUTION) ×4 IMPLANT
WRAP KNEE MAXI GEL POST OP (GAUZE/BANDAGES/DRESSINGS) ×2 IMPLANT

## 2013-07-11 NOTE — Op Note (Signed)
Pre-operative diagnosis- Osteoarthritis  Right knee(s)  Post-operative diagnosis- Osteoarthritis Right knee(s)  Procedure-  Right  Total Knee Arthroplasty (Attune system)  Surgeon- Bianca Ferguson. Kathaleen Dudziak, MD  Assistant- Dimitri Ped, PA-C   Anesthesia-  Spinal EBL-* No blood loss amount entered *  Drains Hemovac  Tourniquet time-  Total Tourniquet Time Documented: Thigh (Right) - 38 minutes Total: Thigh (Right) - 38 minutes    Complications- None  Condition-PACU - hemodynamically stable.   Brief Clinical Note  Bianca Ferguson is a 62 y.o. year old female with end stage OA of her right knee with progressively worsening pain and dysfunction. She has constant pain, with activity and at rest and significant functional deficits with difficulties even with ADLs. She has had extensive non-op management including analgesics, injections of cortisone and viscosupplements, and home exercise program, but remains in significant pain with significant dysfunction.Radiographs show bone on bone arthritis medial compartment. She presents now for right Total Knee Arthroplasty.    Procedure in detail---   The patient is brought into the operating room and positioned supine on the operating table. After successful administration of  Spinal,   a tourniquet is placed high on the  Right thigh(s) and the lower extremity is prepped and draped in the usual sterile fashion. Time out is performed by the operating team and then the  Right lower extremity is wrapped in Esmarch, knee flexed and the tourniquet inflated to 300 mmHg.       A midline incision is made with a ten blade through the subcutaneous tissue to the level of the extensor mechanism. A fresh blade is used to make a medial parapatellar arthrotomy. Soft tissue over the proximal medial tibia is subperiosteally elevated to the joint line with a knife and into the semimembranosus bursa with a Cobb elevator. Soft tissue over the proximal lateral tibia is  elevated with attention being paid to avoiding the patellar tendon on the tibial tubercle. The patella is everted, knee flexed 90 degrees and the ACL and PCL are removed. Findings are bone on bone medial compartment with large global osteophytes.        The drill is used to create a starting hole in the distal femur and the canal is thoroughly irrigated with sterile saline to remove the fatty contents. The 5 degree Right  valgus alignment guide is placed into the femoral canal and the distal femoral cutting block is pinned to remove 9 mm off the distal femur. Resection is made with an oscillating saw.      The tibia is subluxed forward and the menisci are removed. The extramedullary alignment guide is placed referencing proximally at the medial aspect of the tibial tubercle and distally along the second metatarsal axis and tibial crest. The block is pinned to remove 2mm off the more deficient medial  side. Resection is made with an oscillating saw. Size 5is the most appropriate size for the tibia and the proximal tibia is prepared with the modular drill and keel punch for that size.      The femoral sizing guide is placed and size 6 is most appropriate. Rotation is marked off the epicondylar axis and confirmed by creating a rectangular flexion gap at 90 degrees. The size 6 cutting block is pinned in this rotation and the anterior, posterior and chamfer cuts are made with the oscillating saw. The intercondylar block is then placed and that cut is made.      Trial size 5 tibial component, trial size 6 narrow posterior  stabilized femur and a 8  mm posterior stabilized rotating platform insert trial is placed. Full extension is achieved with excellent varus/valgus and anterior/posterior balance throughout full range of motion. The patella is everted and thickness measured to be 22  mm. Free hand resection is taken to 12 mm, a 35 template is placed, lug holes are drilled, trial patella is placed, and it tracks  normally. Osteophytes are removed off the posterior femur with the trial in place. All trials are removed and the cut bone surfaces prepared with pulsatile lavage. Cement is mixed and once ready for implantation, the size 5 tibial implant, size  6 narrow posterior stabilized femoral component, and the size 35 patella are cemented in place and the patella is held with the clamp. The trial insert is placed and the knee held in full extension. The Exparel (20 ml mixed with 30 ml saline) and .25% Bupivicaine, are injected into the extensor mechanism, posterior capsule, medial and lateral gutters and subcutaneous tissues.  All extruded cement is removed and once the cement is hard the permanent 8 mm posterior stabilized rotating platform insert is placed into the tibial tray.      The wound is copiously irrigated with saline solution and the extensor mechanism closed over a hemovac drain with #1 PDS suture. The tourniquet is released for a total tourniquet time of 38  minutes. Flexion against gravity is 135 degrees and the patella tracks normally. Subcutaneous tissue is closed with 2.0 vicryl and subcuticular with running 4.0 Monocryl. The incision is cleaned and dried and steri-strips and a bulky sterile dressing are applied. The limb is placed into a knee immobilizer and the patient is awakened and transported to recovery in stable condition.      Please note that a surgical assistant was a medical necessity for this procedure in order to perform it in a safe and expeditious manner. Surgical assistant was necessary to retract the ligaments and vital neurovascular structures to prevent injury to them and also necessary for proper positioning of the limb to allow for anatomic placement of the prosthesis.   Bianca Ferguson [name], MD    07/11/2013, 10:27 AM

## 2013-07-11 NOTE — Interval H&P Note (Signed)
History and Physical Interval Note:  07/11/2013 6:54 AM  Bianca Ferguson  has presented today for surgery, with the diagnosis of OSTEOARTHRITIS RIGHT KNEE  The various methods of treatment have been discussed with the patient and family. After consideration of risks, benefits and other options for treatment, the patient has consented to  Procedure(s): RIGHT TOTAL KNEE ARTHROPLASTY (Right) as a surgical intervention .  The patient's history has been reviewed, patient examined, no change in status, stable for surgery.  I have reviewed the patient's chart and labs.  Questions were answered to the patient's satisfaction.     [name]

## 2013-07-11 NOTE — Anesthesia Procedure Notes (Signed)
Spinal  Patient location during procedure: OR Start time: 07/11/2013 9:20 AM End time: 07/11/2013 9:22 AM Staffing Anesthesiologist: Lewie Loron R Performed by: anesthesiologist  Preanesthetic Checklist Completed: patient identified, site marked, surgical consent, pre-op evaluation, timeout performed, IV checked, risks and benefits discussed and monitors and equipment checked Spinal Block Patient position: sitting Prep: ChloraPrep Patient monitoring: heart rate, continuous pulse ox and blood pressure Approach: midline Location: L2-3 Injection technique: single-shot Needle Needle type: Sprotte  Needle gauge: 24 G Needle length: 9 cm Additional Notes Expiration date of kit checked and confirmed. Patient tolerated procedure well, without complications.

## 2013-07-11 NOTE — Transfer of Care (Signed)
Immediate Anesthesia Transfer of Care Note  Patient: Bianca Ferguson Reason  Procedure(s) Performed: Procedure(s): RIGHT TOTAL KNEE ARTHROPLASTY (Right)  Patient Location: PACU  Anesthesia Type:Spinal  Level of Consciousness: awake, alert  and oriented  Airway & Oxygen Therapy: Patient Spontanous Breathing and Patient connected to face mask oxygen  Post-op Assessment: Report given to PACU RN and Post -op Vital signs reviewed and stable  Post vital signs: Reviewed and stable  Complications: No apparent anesthesia complications

## 2013-07-11 NOTE — Evaluation (Signed)
Physical Therapy Evaluation Patient Details Name: Bianca Ferguson MRN: [id number] DOB: 1951/06/29 Today's Date: 07/11/2013 Time: 4098-1191 PT Time Calculation (min): 23 min  PT Assessment / Plan / Recommendation History of Present Illness  s/p R TKA   Clinical Impression  Pt will benefit from PT to address deficits below    PT Assessment  Patient needs continued PT services    Follow Up Recommendations  Home health PT    Does the patient have the potential to tolerate intense rehabilitation      Barriers to Discharge        Equipment Recommendations  Rolling walker with 5" wheels    Recommendations for Other Services     Frequency 7X/week    Precautions / Restrictions Precautions Precautions: Knee Required Braces or Orthoses: Knee Immobilizer - Right Knee Immobilizer - Right: Discontinue once straight leg raise with < 10 degree lag (I SLR today) Restrictions Other Position/Activity Restrictions: WBAT   Pertinent Vitals/Pain VSS; O2 sats down to 91 on RA, O2 replaced; pain controlled      Mobility  Bed Mobility Bed Mobility: Supine to Sit Supine to Sit: 4: Min assist Details for Bed Mobility Assistance: cues for technique Transfers Transfers: Sit to Stand;Stand to Sit Sit to Stand: 4: Min assist Stand to Sit: 4: Min assist Details for Transfer Assistance: cues for hand placement and LE position Ambulation/Gait Ambulation/Gait Assistance: 4: Min assist Ambulation Distance (Feet): 35 Feet Assistive device: Rolling walker Ambulation/Gait Assistance Details: cues for sequence and RW distance form self Gait Pattern: Step-to pattern;Antalgic    Exercises Total Joint Exercises Ankle Circles/Pumps: 10 reps;Both;AROM Quad Sets: 10 reps;Both;AROM   PT Diagnosis: Difficulty walking  PT Problem List: Decreased strength;Decreased range of motion;Decreased mobility;Decreased activity tolerance;Decreased knowledge of use of DME PT Treatment Interventions:  Functional mobility training;Gait training;DME instruction;Therapeutic activities;Therapeutic exercise;Patient/family education     PT Goals(Current goals can be found in the care plan section) Acute Rehab PT Goals Patient Stated Goal: return  to I PT Goal Formulation: With patient Time For Goal Achievement: 07/15/13 Potential to Achieve Goals: Good  Visit Information  Last PT Received On: 07/11/13 Assistance Needed: +1 History of Present Illness: s/p R TKA        Prior Functioning  Home Living Family/patient expects to be discharged to:: Private residence Living Arrangements: Spouse/significant other 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: small threshold Home Layout: One level Home Equipment: None Prior Function Level of Independence: Independent Communication Communication: No difficulties    Cognition  Cognition Arousal/Alertness: Awake/alert Behavior During Therapy: WFL for tasks assessed/performed Overall Cognitive Status: Within Functional Limits for tasks assessed    Extremity/Trunk Assessment Upper Extremity Assessment Upper Extremity Assessment: Overall WFL for tasks assessed Lower Extremity Assessment Lower Extremity Assessment: RLE deficits/detail RLE Deficits / Details: independent SLR, ankle WFL   Balance    End of Session PT - End of Session Equipment Utilized During Treatment: Gait belt Activity Tolerance: Patient tolerated treatment well Patient left: in chair;with family/visitor present;with call bell/phone within reach Nurse Communication: Mobility status CPM Right Knee CPM Right Knee: On Right Knee Flexion (Degrees): 40 Right Knee Extension (Degrees): 10 Additional Comments: 4 hrs per day. increase by 10 degrees per day  GP     [facility] 07/11/2013, 2:45 PM

## 2013-07-11 NOTE — Preoperative (Signed)
Beta Blockers   Reason not to administer Beta Blockers:Not Applicable 

## 2013-07-11 NOTE — Progress Notes (Signed)
Patient has sinus drainage w laryngitis.  Sputum has been clear. Afebrile. Dr Lequita Halt into see patient

## 2013-07-11 NOTE — Care Management Note (Signed)
    Page 1 of 2   07/11/2013     3:00:29 PM   CARE MANAGEMENT NOTE 07/11/2013  Patient:  Bianca Ferguson, Bianca Ferguson   Account Number:  [id number]  Date Initiated:  07/11/2013  Documentation initiated by:  Inda Castle  Subjective/Objective Assessment:   see below     Action/Plan:   see below   Anticipated DC Date:  07/12/2013   Anticipated DC Plan:  HOME W HOME HEALTH SERVICES      DC Planning Services  CM consult      [facility] Choice  HOME HEALTH   Choice offered to / List presented to:  C-1 Patient   DME arranged  [facility] Hurst      DME agency  [facility]     HH arranged  HH-2 PT      [facility] agency  [facility]   Status of service:  Completed, signed off Medicare Important Message given?  NA - LOS <3 / Initial given by admissions (If response is "NO", the following Medicare IM given date fields will be blank) Date Medicare IM given:   Date Additional Medicare IM given:    Discharge Disposition:  HOME W HOME HEALTH SERVICES  Per UR Regulation:  Reviewed for med. necessity/level of care/duration of stay  If discussed at Long Length of Stay Meetings, dates discussed:    Comments:  07/11/2013 2pm  Cristine Polio, RN, BSN, CCDS Met with patient in her room to discuss discharge needs. Would like to use [facility] as they provided services for her husband in the past.  Referral called to [facility] [phone]) Her mother is in from out of town and will be here a month to assist patient's husband with care. Needs a rolling walker.Has 3in1 at home.

## 2013-07-11 NOTE — Anesthesia Preprocedure Evaluation (Signed)
Anesthesia Evaluation  Patient identified by MRN, date of birth, ID band Patient awake    Reviewed: Allergy & Precautions, H&P , NPO status , Patient's Chart, lab work & pertinent test results  History of Anesthesia Complications (+) PONV and history of anesthetic complications  Airway Mallampati: I TM Distance: >3 FB Neck ROM: Full    Dental no notable dental hx. (+) Teeth Intact and Dental Advisory Given   Pulmonary former smoker,  breath sounds clear to auscultation  Pulmonary exam normal       Cardiovascular hypertension, Pt. on medications - Peripheral Vascular Disease Rhythm:Regular Rate:Normal     Neuro/Psych PSYCHIATRIC DISORDERS Anxiety negative neurological ROS     GI/Hepatic Neg liver ROS, GERD-  Medicated and Controlled,  Endo/Other  negative endocrine ROS  Renal/GU negative Renal ROS     Musculoskeletal negative musculoskeletal ROS (+)   Abdominal   Peds  Hematology negative hematology ROS (+)   Anesthesia Other Findings   Reproductive/Obstetrics negative OB ROS                           Anesthesia Physical  Anesthesia Plan  ASA: II  Anesthesia Plan: Spinal   Post-op Pain Management:    Induction: Intravenous  Airway Management Planned:   Additional Equipment:   Intra-op Plan:   Post-operative Plan:   Informed Consent: I have reviewed the patients History and Physical, chart, labs and discussed the procedure including the risks, benefits and alternatives for the proposed anesthesia with the patient or authorized representative who has indicated his/her understanding and acceptance.   Dental advisory given  Plan Discussed with: CRNA  Anesthesia Plan Comments:         Anesthesia Quick Evaluation

## 2013-07-11 NOTE — H&P (View-Only) (Signed)
Bianca Ferguson  DOB: 09/19/1950 Married / Language: English / Race: White Female  Date of Admission:  07-11-2013  Chief Complaint:  Right Knee Pain  History of Present Illness The patient is a 63 year old female who comes in for a preoperative History and Physical. The patient is scheduled for a right total knee arthroplasty to be performed by Dr. Gus Rankin. Aluisio, MD at [facility] on 07-11-2013. The patient is a 62 year old female who presents for follow up of their knee. The patient is being followed for their right knee pain and osteoarthritis. Symptoms reported today include: pain, aching and difficulty ambulating. The patient feels that they are doing poorly and report their pain level to be mild to moderate and severe. The following medication has been used for pain control: Ultram. Note for "Follow-up Knee": She is scheduled for surgery on 07/11/13. She states she is not sure how she is going to make it that far. She is ready to proceed with the knee replacement. They have been treated conservatively in the past for the above stated problem and despite conservative measures, they continue to have progressive pain and severe functional limitations and dysfunction. They have failed non-operative management including home exercise, medications, surgery and injections. It is felt that they would benefit from undergoing total joint replacement. Risks and benefits of the procedure have been discussed with the patient and they elect to proceed with surgery. There are no active contraindications to surgery such as ongoing infection or rapidly progressive neurological disease.   Problem List/Past Medical History S/P arthroscopy of right knee (V45.89) Primary osteoarthritis of one knee (715.16) Diverticulitis Of Colon Anxiety Disorder Hypertension Impaired Vision. Right Eye Central Retinal Vein Occlusion Contact dermatitis, allergic Gastroesophageal Reflux  Disease Fibrocystic Disease Of Breast Glaucoma Hemorrhoids Hyperlipidemia Insomnia Mulitple Benign Nevi Obesity Seborrheic Keratosis Syringoma     Allergies No Known Drug Allergies. 12/25/2011    Family History Heart Disease. mother, father and grandmother fathers side Heart disease in female family member before age 48 Kidney disease. father Cancer. grandfather mothers side and grandmother fathers side Chronic Obstructive Lung Disease. mother Congestive Heart Failure. father and grandmother fathers side Osteoarthritis. mother Hypertension. First Degree Relatives. father Siblings. Brother - AIDS    Social History Exercise. Exercises weekly; does running / walking Drug/Alcohol Rehab (Previously). no Living situation. live with spouse Illicit drug use. no Children. 2 Alcohol use. current drinker; drinks wine; only occasionally per week Drug/Alcohol Rehab (Currently). no Current work status. retired Marital status. married Tobacco / smoke exposure. no Pain Contract. no Tobacco use. Former smoker. former smoker; smoke(d) 1 pack(s) per day Post-Surgical Plans. Home    Medication History Meloxicam (15MG  Tablet, 1 (one) Tablet Oral daily, Taken starting 04/21/2013) Active. Crestor (5MG  Tablet, Oral) Active. Losartan Potassium (50MG  Tablet, Oral) Active. Cymbalta (60MG  Capsule DR Part, Oral) Active. Aspirin (81MG  Tablet, 1 (one) Oral) Active.    Spinal Surgery. Date: 2008. Breast Mass; Local Excision. bilateral Hysterectomy. Date: 2002. complete (non-cancerous) Gallbladder Surgery. Date: 23. open Appendectomy. Date: 7. Arthroscopic Knee Surgery - Right. 2013 and 2014   Review of Systems General:Not Present- Chills, Fever, Night Sweats, Fatigue, Weight Gain, Weight Loss and Memory Loss. Skin:Not Present- Hives, Itching, Rash, Eczema and Lesions. HEENT:Not Present- Tinnitus, Headache, Double Vision, Visual Loss,  Hearing Loss and Dentures. Respiratory:Not Present- Shortness of breath with exertion, Shortness of breath at rest, Allergies, Coughing up blood and Chronic Cough. Cardiovascular:Not Present- Chest Pain, Racing/skipping heartbeats, Difficulty Breathing Lying Down, Murmur, Swelling  and Palpitations. Gastrointestinal:Not Present- Bloody Stool, Heartburn, Abdominal Pain, Vomiting, Nausea, Constipation, Diarrhea, Difficulty Swallowing, Jaundice and Loss of appetitie. Female Genitourinary:Not Present- Blood in Urine, Urinary frequency, Weak urinary stream, Discharge, Flank Pain, Incontinence, Painful Urination, Urgency, Urinary Retention and Urinating at Night. Musculoskeletal:Present- Joint Pain. Not Present- Muscle Weakness, Muscle Pain, Joint Swelling, Back Pain, Morning Stiffness and Spasms. Neurological:Not Present- Tremor, Dizziness, Blackout spells, Paralysis, Difficulty with balance and Weakness. Psychiatric:Not Present- Insomnia.    Vitals  Height: 65 in Height was reported by patient. Pulse: 76 (Regular) Resp.: 14 (Unlabored) BP: 128/82 (Sitting, Right Arm, Standard)     Physical Exam  The physical exam findings are as follows:   General Mental Status - Alert, cooperative and good historian. General Appearance- pleasant. Not in acute distress. Orientation- Oriented X3. Build & Nutrition- Well nourished and Well developed.   Head and Neck Head- normocephalic, atraumatic . Neck Global Assessment- supple. no bruit auscultated on the right and no bruit auscultated on the left.   Eye Vision- Wears corrective lenses (readers). Pupil- Bilateral- Regular and Round. Motion- Bilateral- EOMI.   Chest and Lung Exam Auscultation: Breath sounds:- clear at anterior chest wall and - clear at posterior chest wall. Adventitious sounds:- No Adventitious sounds.   Cardiovascular Auscultation:Rhythm- Regular rate and rhythm. Heart Sounds- S1  WNL and S2 WNL. Murmurs & Other Heart Sounds:Auscultation of the heart reveals - No Murmurs.   Abdomen Palpation/Percussion:Tenderness- Abdomen is non-tender to palpation. Rigidity (guarding)- Abdomen is soft. Auscultation:Auscultation of the abdomen reveals - Bowel sounds normal.   Female Genitourinary  Not done, not pertinent to present illness  Musculoskeletal  On exam she is alert and oriented. She is in no acute distress. The right knee is tender medially greater than laterally. There is no effusion noted but she does have moderate soft tissue swelling. She does have pain with passive and active range of motion. Calves are soft, nontender. Distal pulses are 2+. Sensation and motor function is intact in the lower extremities.   Assessment & Plan Primary osteoarthritis of one knee (715.16) Impression: Right Knee  Note: Plan is for a Right Total Knee Replacement by Dr. Lequita Halt.  Plan is to go home  PCP - Dr. Derrell Lolling - Patient has been seen preoperatively and felt to be stable for surgery. Cardiology - Dr. Tresa Endo - Patient has been seen preoperatively and felt to be stable for surgery.  The patient does not have any contraindications and will receive TXA (tranexamic acid) prior to surgery.  Signed electronically by Lauraine Rinne, III PA-C

## 2013-07-11 NOTE — Anesthesia Postprocedure Evaluation (Signed)
Anesthesia Post Note  Patient: Bianca Ferguson  Procedure(s) Performed: Procedure(s) (LRB): RIGHT TOTAL KNEE ARTHROPLASTY (Right)  Anesthesia type: Spinal  Patient location: PACU  Post pain: Pain level controlled  Post assessment: Post-op Vital signs reviewed  Last Vitals: BP 117/77  Pulse 87  Temp(Src) 36.6 C (Oral)  Resp 16  Ht 5\' 4"  (1.626 m)  Wt 194 lb (87.998 kg)  BMI 33.28 kg/m2  SpO2 96%  Post vital signs: Reviewed  Level of consciousness: sedated  Complications: No apparent anesthesia complications

## 2013-07-11 NOTE — Plan of Care (Signed)
Problem: Consults Goal: Diagnosis- Total Joint Replacement Right total knee     

## 2013-07-12 DIAGNOSIS — D62 Acute posthemorrhagic anemia: Secondary | ICD-10-CM | POA: Diagnosis not present

## 2013-07-12 LAB — BASIC METABOLIC PANEL
BUN: 9 mg/dL (ref 6–23)
Creatinine, Ser: 0.46 mg/dL — ABNORMAL LOW (ref 0.50–1.10)
GFR calc Af Amer: >90 mL/min (ref >90)
GFR calc non Af Amer: >90 mL/min (ref >90)
Glucose, Bld: 161 mg/dL — ABNORMAL HIGH (ref 70–99)
Potassium: 4.1 mEq/L (ref 3.7–5.3)

## 2013-07-12 LAB — CBC
HCT: 31.6 % — ABNORMAL LOW (ref 36.0–46.0)
Hemoglobin: 10.5 g/dL — ABNORMAL LOW (ref 12.0–15.0)
MCH: 27.8 pg (ref 26.0–34.0)
MCHC: 33.2 g/dL (ref 30.0–36.0)
MCV: 83.6 fL (ref 78.0–100.0)
RBC: 3.78 MIL/uL — ABNORMAL LOW (ref 3.87–5.11)
RDW: 13.6 % (ref 11.5–15.5)

## 2013-07-12 MED ORDER — ZOLPIDEM TARTRATE 5 MG PO TABS
5.0000 mg | ORAL_TABLET | Freq: Every evening | ORAL | Status: DC | PRN
  Administered 2013-07-12: 22:00:00 5 mg via ORAL
  Filled 2013-07-12: qty 1

## 2013-07-12 NOTE — Progress Notes (Signed)
07/12/13 1400  PT Visit Information  Last PT Received On 07/12/13  Assistance Needed +1  History of Present Illness s/p R TKA   PT Time Calculation  PT Start Time 1410  PT Stop Time 1428  PT Time Calculation (min) 18 min  Subjective Data  Patient Stated Goal return  to I  Precautions  Precautions Knee  Precaution Comments I SLR today  Knee Immobilizer - Right Discontinue once straight leg raise with < 10 degree lag  Restrictions  Other Position/Activity Restrictions WBAT  Cognition  Arousal/Alertness Awake/alert  Behavior During Therapy WFL for tasks assessed/performed  Overall Cognitive Status Within Functional Limits for tasks assessed  Bed Mobility  Bed Mobility Supine to Sit;Sit to Supine  Supine to Sit 5: Supervision  Sit to Supine 5: Supervision  Details for Bed Mobility Assistance cues for lateral scoot  Transfers  Transfers Sit to Stand;Stand to Sit  Sit to Stand 4: Min guard;5: Supervision  Stand to Sit 5: Supervision;4: Min guard  Details for Transfer Assistance cues for UE/LE placement  Ambulation/Gait  Ambulation/Gait Assistance 5: Supervision  Ambulation Distance (Feet) 140 Feet  Assistive device Rolling walker  Ambulation/Gait Assistance Details cues for heel to toe, step through   Gait Pattern Step-through pattern  Total Joint Exercises  Heel Slides AROM;AAROM;Right;10 reps  Goniometric ROM 10-85*  PT - End of Session  Equipment Utilized During Treatment Gait belt  Activity Tolerance Patient tolerated treatment well  Patient left in bed;with call bell/phone within reach;with family/visitor present  Nurse Communication Mobility status  PT - Assessment/Plan  PT Plan Current plan remains appropriate  PT Frequency 7X/week  Follow Up Recommendations Home health PT  PT equipment Rolling walker with 5" wheels  PT Goal Progression  Progress towards PT goals Progressing toward goals  Acute Rehab PT Goals  Time For Goal Achievement 07/15/13  Potential to  Achieve Goals Good  PT General Charges  $$ ACUTE PT VISIT 1 Procedure  PT Treatments  $Gait Training 8-22 mins

## 2013-07-12 NOTE — Progress Notes (Signed)
Physical Therapy Treatment Patient Details Name: Bianca Ferguson MRN: [id number] DOB: May 17, 1951 Today's Date: 07/12/2013 Time: 4098-1191 PT Time Calculation (min): 29 min  PT Assessment / Plan / Recommendation  History of Present Illness s/p R TKA    PT Comments   Pt progressing, some pain issues but willing to continue therapy  Follow Up Recommendations  Home health PT     Does the patient have the potential to tolerate intense rehabilitation     Barriers to Discharge        Equipment Recommendations  Rolling walker with 5" wheels    Recommendations for Other Services    Frequency 7X/week   Progress towards PT Goals Progress towards PT goals: Progressing toward goals  Plan Current plan remains appropriate    Precautions / Restrictions Precautions Precautions: Knee Required Braces or Orthoses: Knee Immobilizer - Right Knee Immobilizer - Right: Discontinue once straight leg raise with < 10 degree lag Restrictions Other Position/Activity Restrictions: WBAT   Pertinent Vitals/Pain Still with c/o right knee pain; just had additional meds and willing to do therapy    Mobility  Bed Mobility Bed Mobility: Supine to Sit Supine to Sit: 4: Min guard Details for Bed Mobility Assistance: assist for RLE Transfers Transfers: Sit to Stand;Stand to Sit Sit to Stand: 4: Min guard;5: Supervision (x2) Stand to Sit: 5: Supervision;4: Min guard Details for Transfer Assistance: cues for UE/LE placement Ambulation/Gait Ambulation/Gait Assistance: 5: Supervision;4: Min guard Ambulation Distance (Feet): 120 Feet Assistive device: Rolling walker Ambulation/Gait Assistance Details: cues for sequence, use of UEs and knee flexion during swing phase on right Gait Pattern: Step-to pattern    Exercises Total Joint Exercises Quad Sets: 10 reps;Both;AROM Heel Slides: AROM;AAROM;Right;10 reps Hip ABduction/ADduction: AROM;AAROM;Right;10 reps Straight Leg Raises: AROM;AAROM;Right;10 reps    PT Diagnosis:    PT Problem List:   PT Treatment Interventions:     PT Goals (current goals can now be found in the care plan section) Acute Rehab PT Goals Patient Stated Goal: return  to I PT Goal Formulation: With patient Potential to Achieve Goals: Good  Visit Information  Last PT Received On: 07/12/13 Assistance Needed: +1 History of Present Illness: s/p R TKA     Subjective Data  Subjective: I feel great, said with sarcasm Patient Stated Goal: return  to I   Cognition  Cognition Arousal/Alertness: Awake/alert Behavior During Therapy: WFL for tasks assessed/performed Overall Cognitive Status: Within Functional Limits for tasks assessed    Balance     End of Session PT - End of Session Activity Tolerance: Patient tolerated treatment well Patient left: in chair;with family/visitor present;with call bell/phone within reach Nurse Communication: Mobility status   GP     [facility] 07/12/2013, 11:36 AM

## 2013-07-12 NOTE — Evaluation (Signed)
Occupational Therapy Evaluation Patient Details Name: Bianca Ferguson MRN: [id number] DOB: 09/27/1950 Today's Date: 07/12/2013 Time: 4098-1191 OT Time Calculation (min): 33 min  OT Assessment / Plan / Recommendation History of present illness s/p R TKA    Clinical Impression   Pt was admitted for the above surgery.  All education was completed.  Pt does not need any further OT at this time.      OT Assessment  Patient does not need any further OT services    Follow Up Recommendations  No OT follow up    Barriers to Discharge      Equipment Recommendations  None recommended by OT    Recommendations for Other Services    Frequency       Precautions / Restrictions Precautions Precautions: Knee Knee Immobilizer - Right: Discontinue once straight leg raise with < 10 degree lag Restrictions Other Position/Activity Restrictions: WBAT   Pertinent Vitals/Pain Not bad in bed, 7/10 RLE with weightbearing.  Repositioned, applied ice, and requested pain meds at end of session    ADL  Grooming: Supervision/safety;Teeth care Where Assessed - Grooming: Supported standing Lower Body Bathing: Minimal assistance Where Assessed - Lower Body Bathing: Supported sit to stand Lower Body Dressing: Moderate assistance Where Assessed - Lower Body Dressing: Supported sit to stand Toilet Transfer: Hydrographic surveyor Method: Sit to Barista: Comfort height toilet;Grab bars Toileting - Architect and Hygiene: Supervision/safety Where Assessed - Engineer, mining and Hygiene: Sit to stand from 3-in-1 or toilet Equipment Used: Rolling walker Transfers/Ambulation Related to ADLs: ambulated to bathroom with cues for sit to stand and walker distance--tended to keep it too close.  Pt reports she has not slept in 24 hours.  She sat in chair for more than 2 hours early this am.  Repositioned in bed so that she could nap.   ADL Comments: educated  on shower transfer, as pt's ledge is much shorter and pain increased with weightbearing.  Will have 24/7 available to help with adls as needed. Pt's husband has AE kit and she is familiar with use. Pt can complete UB adls with set up    OT Diagnosis:    OT Problem List:   OT Treatment Interventions:     OT Goals(Current goals can be found in the care plan section)    Visit Information  Last OT Received On: 07/12/13 Assistance Needed: +1 History of Present Illness: s/p R TKA        Prior Functioning     Home Living Family/patient expects to be discharged to:: Private residence Home Equipment: Bedside commode;Grab bars - toilet Prior Function Level of Independence: Independent Communication Communication: No difficulties         Vision/Perception     Cognition  Cognition Arousal/Alertness: Awake/alert Behavior During Therapy: WFL for tasks assessed/performed Overall Cognitive Status: Within Functional Limits for tasks assessed    Extremity/Trunk Assessment Upper Extremity Assessment Upper Extremity Assessment: Overall WFL for tasks assessed     Mobility Bed Mobility Supine to Sit: 4: Min assist Details for Bed Mobility Assistance: assist for RLE Transfers Sit to Stand: 4: Min guard Stand to Sit: 4: Min guard Details for Transfer Assistance: cues for UE/LE placement     Exercise     Balance     End of Session OT - End of Session Activity Tolerance: Patient tolerated treatment well Patient left: in bed;with call bell/phone within reach  GO     [facility] 07/12/2013, 8:47 AM Marica Otter,  OTR/L [phone] 07/12/2013

## 2013-07-12 NOTE — Progress Notes (Signed)
Utilization review completed.  

## 2013-07-12 NOTE — Progress Notes (Signed)
07/12/2013 Colleen Can BSN RN CCM [phone] DME-RW has been delivered to patient's room. Plans are for home with [facility] in place upon discharge.

## 2013-07-12 NOTE — Progress Notes (Signed)
   Subjective: 1 Day Post-Op Procedure(s) (LRB): RIGHT TOTAL KNEE ARTHROPLASTY (Right) Patient reports pain as moderate.   Patient seen in rounds with Dr. Lequita Halt.  She complains of no sleep last night. Patient is having problems with pain in the knee, requiring pain medications Started therapy yesterday and walked 35 feet on day of surgery. Plan is to go Home after hospital stay.  Objective: Vital signs in last 24 hours: Temp:  [97.2 F (36.2 C)-98.5 F (36.9 C)] 97.2 F (36.2 C) (12/30 0620) Pulse Rate:  [67-87] 67 (12/30 0620) Resp:  [14-18] 16 (12/30 0620) BP: (108-128)/(60-77) 123/77 mmHg (12/30 0620) SpO2:  [92 %-100 %] 96 % (12/30 0620) Weight:  [87.998 kg (194 lb)] 87.998 kg (194 lb) (12/29 1215)  Intake/Output from previous day:  Intake/Output Summary (Last 24 hours) at 07/12/13 0819 Last data filed at 07/12/13 0651  Gross per 24 hour  Intake   4500 ml  Output   3355 ml  Net   1145 ml    Intake/Output this shift: UOP 1950  Labs:  Recent Labs  07/12/13 0455  HGB 10.5*    Recent Labs  07/12/13 0455  WBC 15.4*  RBC 3.78*  HCT 31.6*  PLT 326    Recent Labs  07/12/13 0455  NA 138  K 4.1  CL 102  CO2 25  BUN 9  CREATININE 0.46*  GLUCOSE 161*  CALCIUM 8.7   No results found for this basename: LABPT, INR,  in the last 72 hours  EXAM General - Patient is Alert, Appropriate and Oriented Extremity - Neurovascular intact Sensation intact distally Dressing - dressing C/D/I Motor Function - intact, moving foot and toes well on exam.  Hemovac pulled without difficulty.  Past Medical History  Diagnosis Date  . Arthritis   . Anxiety   . ADD (attention deficit disorder with hyperactivity)   . GERD (gastroesophageal reflux disease)   . Diverticulitis 04/2011  . Hypertension     no longer treated bp wnl x 1 year  . Hyperlipidemia   . Retinal vein occlusion     has appointment w Dr. Ashley Royalty 2/24  . Complication of anesthesia     nausea  .  Frequency     Assessment/Plan: 1 Day Post-Op Procedure(s) (LRB): RIGHT TOTAL KNEE ARTHROPLASTY (Right) Principal Problem:   OA (osteoarthritis) of knee Active Problems:   Postoperative anemia due to acute blood loss  Estimated body mass index is 33.28 kg/(m^2) as calculated from the following:   Height as of this encounter: 5\' 4"  (1.626 m).   Weight as of this encounter: 87.998 kg (194 lb). Advance diet Up with therapy Plan for discharge tomorrow Discharge home with home health  DVT Prophylaxis - Xarelto Weight-Bearing as tolerated to right leg D/C O2 and Pulse OX and try on Room Air  Zaquan Duffner, Marlowe Sax 07/12/2013, 8:19 AM

## 2013-07-13 LAB — CBC
Hemoglobin: 10.2 g/dL — ABNORMAL LOW (ref 12.0–15.0)
MCH: 27 pg (ref 26.0–34.0)
MCHC: 32 g/dL (ref 30.0–36.0)
MCV: 84.4 fL (ref 78.0–100.0)
RDW: 13.7 % (ref 11.5–15.5)

## 2013-07-13 LAB — BASIC METABOLIC PANEL
BUN: 10 mg/dL (ref 6–23)
Calcium: 8.9 mg/dL (ref 8.4–10.5)
Creatinine, Ser: 0.5 mg/dL (ref 0.50–1.10)
GFR calc Af Amer: >90 mL/min (ref >90)
GFR calc non Af Amer: >90 mL/min (ref >90)
Glucose, Bld: 123 mg/dL — ABNORMAL HIGH (ref 70–99)
Potassium: 3.9 mEq/L (ref 3.7–5.3)
Sodium: 137 mEq/L (ref 137–147)

## 2013-07-13 MED ORDER — TRAMADOL HCL 50 MG PO TABS: 50.0000 mg | ORAL_TABLET | Freq: Four times a day (QID) | ORAL | Status: DC | PRN

## 2013-07-13 MED ORDER — HYDROMORPHONE HCL 2 MG PO TABS: 2.0000 mg | ORAL_TABLET | ORAL | Status: DC | PRN

## 2013-07-13 MED ORDER — METHOCARBAMOL 500 MG PO TABS: 500.0000 mg | ORAL_TABLET | Freq: Four times a day (QID) | ORAL | Status: DC | PRN

## 2013-07-13 MED ORDER — RIVAROXABAN 10 MG PO TABS: 10.0000 mg | ORAL_TABLET | Freq: Every day | ORAL | Status: DC

## 2013-07-13 MED ORDER — ZOLPIDEM TARTRATE 5 MG PO TABS: 5.0000 mg | ORAL_TABLET | Freq: Every evening | ORAL | Status: DC | PRN

## 2013-07-13 NOTE — Progress Notes (Signed)
   Subjective: 2 Days Post-Op Procedure(s) (LRB): RIGHT TOTAL KNEE ARTHROPLASTY (Right) Patient reports pain as mild.   Patient seen in rounds by Dr. Lequita Halt. Patient is well, and has had no acute complaints or problems Patient is ready to go home later today.  Objective: Vital signs in last 24 hours: Temp:  [97.7 F (36.5 C)-98.7 F (37.1 C)] 98.1 F (36.7 C) (12/31 0649) Pulse Rate:  [85-88] 85 (12/31 0649) Resp:  [16-20] 16 (12/31 0649) BP: (119-144)/(65-87) 122/87 mmHg (12/31 0649) SpO2:  [91 %-98 %] 91 % (12/31 0649)  Intake/Output from previous day:  Intake/Output Summary (Last 24 hours) at 07/13/13 0825 Last data filed at 07/13/13 0526  Gross per 24 hour  Intake   1065 ml  Output   3025 ml  Net  -1960 ml    Intake/Output this shift:    Labs:  Recent Labs  07/12/13 0455 07/13/13 0546  HGB 10.5* 10.2*    Recent Labs  07/12/13 0455 07/13/13 0546  WBC 15.4* 13.8*  RBC 3.78* 3.78*  HCT 31.6* 31.9*  PLT 326 346    Recent Labs  07/12/13 0455 07/13/13 0546  NA 138 137  K 4.1 3.9  CL 102 98  CO2 25 27  BUN 9 10  CREATININE 0.46* 0.50  GLUCOSE 161* 123*  CALCIUM 8.7 8.9   No results found for this basename: LABPT, INR,  in the last 72 hours  EXAM: General - Patient is Alert, Appropriate and Oriented Extremity - Neurovascular intact Sensation intact distally Incision - clean, dry, no drainage, healing Motor Function - intact, moving foot and toes well on exam.   Assessment/Plan: 2 Days Post-Op Procedure(s) (LRB): RIGHT TOTAL KNEE ARTHROPLASTY (Right) Procedure(s) (LRB): RIGHT TOTAL KNEE ARTHROPLASTY (Right) Past Medical History  Diagnosis Date  . Arthritis   . Anxiety   . ADD (attention deficit disorder with hyperactivity)   . GERD (gastroesophageal reflux disease)   . Diverticulitis 04/2011  . Hypertension     no longer treated bp wnl x 1 year  . Hyperlipidemia   . Retinal vein occlusion     has appointment w Dr. Ashley Royalty 2/24  .  Complication of anesthesia     nausea  . Frequency    Principal Problem:   OA (osteoarthritis) of knee Active Problems:   Postoperative anemia due to acute blood loss  Estimated body mass index is 33.28 kg/(m^2) as calculated from the following:   Height as of this encounter: 5\' 4"  (1.626 m).   Weight as of this encounter: 87.998 kg (194 lb). Up with therapy Discharge home with home health Diet - Cardiac diet Follow up - in 2 weeks Activity - WBAT Disposition - Home Condition Upon Discharge - Good D/C Meds - See DC Summary DVT Prophylaxis - Xarelto  PERKINS, ALEXZANDREW 07/13/2013, 8:25 AM

## 2013-07-13 NOTE — Progress Notes (Signed)
Physical Therapy Treatment Patient Details Name: Bianca Ferguson MRN: [id number] DOB: 22-Feb-1951 Today's Date: 07/13/2013 Time: 1040-1110 PT Time Calculation (min): 30 min  PT Assessment / Plan / Recommendation  History of Present Illness s/p R TKA    PT Comments   POD # 2 am session.  Applied KI then assisted OOB to amb in hallway.  Then assisted back to bed to perform TKR TE's followed by ICE.  Pt plans to D/C later today so will see agin to perform stair training.    Follow Up Recommendations  Home health PT     Does the patient have the potential to tolerate intense rehabilitation     Barriers to Discharge        Equipment Recommendations  Rolling walker with 5" wheels    Recommendations for Other Services    Frequency 7X/week   Progress towards PT Goals Progress towards PT goals: Progressing toward goals  Plan Current plan remains appropriate    Precautions / Restrictions Precautions Precautions: Knee Required Braces or Orthoses: Knee Immobilizer - Right Knee Immobilizer - Right: Discontinue once straight leg raise with < 10 degree lag Restrictions Weight Bearing Restrictions: No Other Position/Activity Restrictions: WBAT    Pertinent Vitals/Pain C/o 3/10 "not bad'    Mobility  Bed Mobility Bed Mobility: Supine to Sit;Sit to Supine Supine to Sit: 5: Supervision Sit to Supine: 5: Supervision Details for Bed Mobility Assistance: cues for lateral scoot Transfers Transfers: Sit to Stand;Stand to Sit Sit to Stand: 4: Min guard;5: Supervision;From bed Stand to Sit: 5: Supervision;4: Min guard;To bed Details for Transfer Assistance: cues for UE/LE placement Ambulation/Gait Ambulation/Gait Assistance: 5: Supervision;4: Min guard Ambulation Distance (Feet): 145 Feet Assistive device: Rolling walker Ambulation/Gait Assistance Details: <25% VC's on safety with turns and increased time Gait Pattern: Step-through pattern Gait velocity: decreased    Exercises    Total Knee Replacement TE's 10 reps B LE ankle pumps 10 reps knee presses 10 reps heel slides  10 reps SAQ's 10 reps SLR's 10 reps ABD Followed by ICE    PT Goals (current goals can now be found in the care plan section)    Visit Information  Last PT Received On: 07/13/13 Assistance Needed: +1 History of Present Illness: s/p R TKA     Subjective Data      Cognition       Balance     End of Session PT - End of Session Equipment Utilized During Treatment: Gait belt Activity Tolerance: Patient tolerated treatment well Patient left: in bed;with call bell/phone within reach;with family/visitor present CPM Right Knee CPM Right Knee: On   Felecia Shelling  PTA [facility]  Acute  Rehab Pager      [phone]

## 2013-07-13 NOTE — Discharge Summary (Signed)
Physician Discharge Summary   Patient ID: Bianca Ferguson MRN: [id number] DOB/AGE: 1951/04/29 62 y.o.  Admit date: 07/11/2013 Discharge date: 07/13/2013  Primary Diagnosis:  Osteoarthritis Right knee(s)  Admission Diagnoses:  Past Medical History  Diagnosis Date  . Arthritis   . Anxiety   . ADD (attention deficit disorder with hyperactivity)   . GERD (gastroesophageal reflux disease)   . Diverticulitis 04/2011  . Hypertension     no longer treated bp wnl x 1 year  . Hyperlipidemia   . Retinal vein occlusion     has appointment w Dr. Ashley Royalty 2/24  . Complication of anesthesia     nausea  . Frequency    Discharge Diagnoses:   Principal Problem:   OA (osteoarthritis) of knee Active Problems:   Postoperative anemia due to acute blood loss  Estimated body mass index is 33.28 kg/(m^2) as calculated from the following:   Height as of this encounter: 5\' 4"  (1.626 m).   Weight as of this encounter: 87.998 kg (194 lb).  Procedure:  Procedure(s) (LRB): RIGHT TOTAL KNEE ARTHROPLASTY (Right)   Consults: None  HPI: Bianca Ferguson is a 62 y.o. year old female with end stage OA of her right knee with progressively worsening pain and dysfunction. She has constant pain, with activity and at rest and significant functional deficits with difficulties even with ADLs. She has had extensive non-op management including analgesics, injections of cortisone and viscosupplements, and home exercise program, but remains in significant pain with significant dysfunction.Radiographs show bone on bone arthritis medial compartment. She presents now for right Total Knee Arthroplasty.   Laboratory Data: Admission on 07/11/2013, Discharged on 07/13/2013  Component Date Value Range Status  . ABO/RH(D) 07/11/2013 O NEG   Final  . Antibody Screen 07/11/2013 NEG   Final  . Sample Expiration 07/11/2013 07/14/2013   Final  . WBC 07/12/2013 15.4* 4.0 - 10.5 K/uL Final  . RBC 07/12/2013 3.78* 3.87  - 5.11 MIL/uL Final  . Hemoglobin 07/12/2013 10.5* 12.0 - 15.0 g/dL Final  . HCT 62/13/0865 31.6* 36.0 - 46.0 % Final  . MCV 07/12/2013 83.6  78.0 - 100.0 fL Final  . MCH 07/12/2013 27.8  26.0 - 34.0 pg Final  . MCHC 07/12/2013 33.2  30.0 - 36.0 g/dL Final  . RDW 78/46/9629 13.6  11.5 - 15.5 % Final  . Platelets 07/12/2013 326  150 - 400 K/uL Final  . Sodium 07/12/2013 138  137 - 147 mEq/L Final  . Potassium 07/12/2013 4.1  3.7 - 5.3 mEq/L Final  . Chloride 07/12/2013 102  96 - 112 mEq/L Final  . CO2 07/12/2013 25  19 - 32 mEq/L Final  . Glucose, Bld 07/12/2013 161* 70 - 99 mg/dL Final  . BUN 52/84/1324 9  6 - 23 mg/dL Final  . Creatinine, Ser 07/12/2013 0.46* 0.50 - 1.10 mg/dL Final  . Calcium 40/04/2724 8.7  8.4 - 10.5 mg/dL Final  . GFR calc non Af Amer 07/12/2013 >90  >90 mL/min Final  . GFR calc Af Amer 07/12/2013 >90  >90 mL/min Final   Comment: (NOTE)                          The eGFR has been calculated using the CKD EPI equation.                          This calculation has not been validated in all clinical situations.  eGFR's persistently <90 mL/min signify possible Chronic Kidney                          Disease.  . WBC 07/13/2013 13.8* 4.0 - 10.5 K/uL Final  . RBC 07/13/2013 3.78* 3.87 - 5.11 MIL/uL Final  . Hemoglobin 07/13/2013 10.2* 12.0 - 15.0 g/dL Final  . HCT 16/04/9603 31.9* 36.0 - 46.0 % Final  . MCV 07/13/2013 84.4  78.0 - 100.0 fL Final  . MCH 07/13/2013 27.0  26.0 - 34.0 pg Final  . MCHC 07/13/2013 32.0  30.0 - 36.0 g/dL Final  . RDW 54/03/8118 13.7  11.5 - 15.5 % Final  . Platelets 07/13/2013 346  150 - 400 K/uL Final  . Sodium 07/13/2013 137  137 - 147 mEq/L Final   Please note change in reference range.  . Potassium 07/13/2013 3.9  3.7 - 5.3 mEq/L Final   Please note change in reference range.  . Chloride 07/13/2013 98  96 - 112 mEq/L Final  . CO2 07/13/2013 27  19 - 32 mEq/L Final  . Glucose, Bld 07/13/2013 123* 70 - 99 mg/dL  Final  . BUN 14/78/2956 10  6 - 23 mg/dL Final  . Creatinine, Ser 07/13/2013 0.50  0.50 - 1.10 mg/dL Final  . Calcium 21/30/8657 8.9  8.4 - 10.5 mg/dL Final  . GFR calc non Af Amer 07/13/2013 >90  >90 mL/min Final  . GFR calc Af Amer 07/13/2013 >90  >90 mL/min Final   Comment: (NOTE)                          The eGFR has been calculated using the CKD EPI equation.                          This calculation has not been validated in all clinical situations.                          eGFR's persistently <90 mL/min signify possible Chronic Kidney                          Disease.  Hospital Outpatient Visit on 06/30/2013  Component Date Value Range Status  . aPTT 06/30/2013 31  24 - 37 seconds Final  . WBC 06/30/2013 11.8* 4.0 - 10.5 K/uL Final  . RBC 06/30/2013 4.81  3.87 - 5.11 MIL/uL Final  . Hemoglobin 06/30/2013 13.2  12.0 - 15.0 g/dL Final  . HCT 84/69/6295 40.1  36.0 - 46.0 % Final  . MCV 06/30/2013 83.4  78.0 - 100.0 fL Final  . MCH 06/30/2013 27.4  26.0 - 34.0 pg Final  . MCHC 06/30/2013 32.9  30.0 - 36.0 g/dL Final  . RDW 28/41/3244 12.9  11.5 - 15.5 % Final  . Platelets 06/30/2013 384  150 - 400 K/uL Final  . Sodium 06/30/2013 134* 135 - 145 mEq/L Final  . Potassium 06/30/2013 4.3  3.5 - 5.1 mEq/L Final  . Chloride 06/30/2013 97  96 - 112 mEq/L Final  . CO2 06/30/2013 27  19 - 32 mEq/L Final  . Glucose, Bld 06/30/2013 83  70 - 99 mg/dL Final  . BUN 07/16/7251 14  6 - 23 mg/dL Final  . Creatinine, Ser 06/30/2013 0.62  0.50 - 1.10 mg/dL Final  . Calcium 66/44/0347 9.7  8.4 -  10.5 mg/dL Final  . Total Protein 06/30/2013 7.4  6.0 - 8.3 g/dL Final  . Albumin 08/65/7846 3.9  3.5 - 5.2 g/dL Final  . AST 96/29/5284 45* 0 - 37 U/L Final  . ALT 06/30/2013 37* 0 - 35 U/L Final  . Alkaline Phosphatase 06/30/2013 111  39 - 117 U/L Final  . Total Bilirubin 06/30/2013 0.5  0.3 - 1.2 mg/dL Final  . GFR calc non Af Amer 06/30/2013 >90  >90 mL/min Final  . GFR calc Af Amer 06/30/2013 >90   >90 mL/min Final   Comment: (NOTE)                          The eGFR has been calculated using the CKD EPI equation.                          This calculation has not been validated in all clinical situations.                          eGFR's persistently <90 mL/min signify possible Chronic Kidney                          Disease.  Marland Kitchen Prothrombin Time 06/30/2013 12.9  11.6 - 15.2 seconds Final  . INR 06/30/2013 0.99  0.00 - 1.49 Final  . Color, Urine 06/30/2013 YELLOW  YELLOW Final  . APPearance 06/30/2013 CLEAR  CLEAR Final  . Specific Gravity, Urine 06/30/2013 1.015  1.005 - 1.030 Final  . pH 06/30/2013 7.0  5.0 - 8.0 Final  . Glucose, UA 06/30/2013 NEGATIVE  NEGATIVE mg/dL Final  . Hgb urine dipstick 06/30/2013 NEGATIVE  NEGATIVE Final  . Bilirubin Urine 06/30/2013 NEGATIVE  NEGATIVE Final  . Ketones, ur 06/30/2013 NEGATIVE  NEGATIVE mg/dL Final  . Protein, ur 13/24/4010 NEGATIVE  NEGATIVE mg/dL Final  . Urobilinogen, UA 06/30/2013 0.2  0.0 - 1.0 mg/dL Final  . Nitrite 27/25/3664 NEGATIVE  NEGATIVE Final  . Leukocytes, UA 06/30/2013 NEGATIVE  NEGATIVE Final   MICROSCOPIC NOT DONE ON URINES WITH NEGATIVE PROTEIN, BLOOD, LEUKOCYTES, NITRITE, OR GLUCOSE <1000 mg/dL.  Marland Kitchen MRSA, PCR 06/30/2013 NEGATIVE  NEGATIVE Final  . Staphylococcus aureus 06/30/2013 NEGATIVE  NEGATIVE Final   Comment:                                 The Xpert SA Assay (FDA                          approved for NASAL specimens                          in patients over 78 years of age),                          is one component of                          a comprehensive surveillance                          program.  Test performance has  been validated by [facility] for patients greater                          than or equal to 87 year old.                          It is not intended                          to diagnose infection nor to                          guide  or monitor treatment.     X-Rays:Dg Chest 2 View  06/30/2013   CLINICAL DATA:  Preop respiratory exam for right total knee arthroplasty. Hypertension.  EXAM: CHEST  2 VIEW  COMPARISON:  01/01/12  FINDINGS: The heart size and mediastinal contours are within normal limits. Both lungs are clear. The visualized skeletal structures are unremarkable.  IMPRESSION: No active cardiopulmonary disease.   Electronically Signed   By: Myles Rosenthal M.D.   On: 06/30/2013 15:05    EKG: Orders placed in visit on 07/01/13  . EKG 12-LEAD     Hospital Course: Bianca Ferguson is a 62 y.o. who was admitted to [facility]. They were brought to the operating room on 07/11/2013 and underwent Procedure(s): RIGHT TOTAL KNEE ARTHROPLASTY.  Patient tolerated the procedure well and was later transferred to the recovery room and then to the orthopaedic floor for postoperative care.  They were given PO and IV analgesics for pain control following their surgery.  They were given 24 hours of postoperative antibiotics of  Anti-infectives   Start     Dose/Rate Route Frequency Ordered Stop   07/11/13 1600  ceFAZolin (ANCEF) IVPB 1 g/50 mL premix     1 g 100 mL/hr over 30 Minutes Intravenous Every 6 hours 07/11/13 1226 07/11/13 2208   07/11/13 0700  ceFAZolin (ANCEF) IVPB 2 g/50 mL premix     2 g 100 mL/hr over 30 Minutes Intravenous On call to O.R. 07/11/13 4540 07/11/13 0915     and started on DVT prophylaxis in the form of Xarelto.   PT and OT were ordered for total joint protocol.  Discharge planning consulted to help with postop disposition and equipment needs.  Patient had a tough night on the evening of surgery. Started therapy yesterday and walked 35 feet on day of surgery.  They continued to get up OOB with therapy on day one. Hemovac drain was pulled without difficulty.  Continued to work with therapy into day two.  Dressing was changed on day two and the incision was healing well.   Patient was seen in  rounds and was ready to go home later that same day.   Discharge Medications: Prior to Admission medications   Medication Sig Start Date End Date Taking? Authorizing Provider  busPIRone (BUSPAR) 5 MG tablet Take 1 tablet by mouth daily. 06/20/13  Yes Historical Provider, MD  diphenhydrAMINE (BENADRYL) 25 MG tablet Take 50 mg by mouth every 6 (six) hours as needed.   Yes Historical Provider, MD  DULoxetine (CYMBALTA) 60 MG capsule Take 60 mg by mouth at bedtime.   Yes Historical Provider,  MD  fluticasone (FLONASE) 50 MCG/ACT nasal spray Place 1 spray into the nose at bedtime. 12/28/12  Yes Historical Provider, MD  losartan (COZAAR) 50 MG tablet Take 50 mg by mouth at bedtime.  12/15/12  Yes Historical Provider, MD  meloxicam (MOBIC) 15 MG tablet Take 15 mg by mouth daily.   Yes Historical Provider, MD  rosuvastatin (CRESTOR) 40 MG tablet Take 40 mg by mouth at bedtime.   Yes Historical Provider, MD  Biotin 10 MG CAPS Take 1 capsule by mouth daily.    Historical Provider, MD  Calcium-Vitamin D-Vitamin K (CALCIUM SOFT CHEWS PO) Take 500 mg by mouth 2 (two) times daily.    Historical Provider, MD  Coenzyme Q10 (CO Q 10) 100 MG CAPS Take 3 capsules by mouth daily.    Historical Provider, MD  HYDROmorphone (DILAUDID) 2 MG tablet Take 1-2 tablets (2-4 mg total) by mouth every 4 (four) hours as needed for moderate pain. 07/13/13   Terez Montee, PA-C  ibuprofen (ADVIL,MOTRIN) 200 MG tablet Take 600 mg by mouth every 6 (six) hours as needed. For pain    Historical Provider, MD  methocarbamol (ROBAXIN) 500 MG tablet Take 1 tablet (500 mg total) by mouth every 6 (six) hours as needed for muscle spasms. 07/13/13   Maudry Zeidan, PA-C  Multiple Vitamin (MULTIVITAMIN) tablet Take 1 tablet by mouth daily.    Historical Provider, MD  Omega-3 Fatty Acids (FISH OIL) 1200 MG CAPS Take 1 capsule by mouth daily.    Historical Provider, MD  rivaroxaban (XARELTO) 10 MG TABS tablet Take 1 tablet (10 mg total)  by mouth daily with breakfast. Take Xarelto for two and a half more weeks, then discontinue Xarelto. Once the patient has completed the blood thinner regimen, then take a Baby 81 mg Aspirin daily for four more weeks. 07/13/13   Sabryna Lahm, PA-C  traMADol (ULTRAM) 50 MG tablet Take 1 tablet (50 mg total) by mouth every 6 (six) hours as needed (mild pain). 07/13/13   Dhalia Zingaro, PA-C  zolpidem (AMBIEN) 5 MG tablet Take 1 tablet (5 mg total) by mouth at bedtime as needed for sleep. 07/13/13   Carden Teel Julien Girt, PA-C   Discharge home with home health  Diet - Cardiac diet  Follow up - in 2 weeks  Activity - WBAT  Disposition - Home  Condition Upon Discharge - Good  D/C Meds - See DC Summary  DVT Prophylaxis - Xarelto      Discharge Orders   Future Appointments Provider Department Dept Phone   09/23/2013 7:30 AM Sherrie George, MD [facility] [phone]   Future Orders Complete By Expires   Call MD / Call 911  As directed    Comments:     If you experience chest pain or shortness of breath, CALL 911 and be transported to the hospital emergency room.  If you develope a fever above 101 F, pus (white drainage) or increased drainage or redness at the wound, or calf pain, call your surgeon's office.   Change dressing  As directed    Comments:     Change dressing daily with sterile 4 x 4 inch gauze dressing and apply TED hose. Do not submerge the incision under water.   Constipation Prevention  As directed    Comments:     Drink plenty of fluids.  Prune juice may be helpful.  You may use a stool softener, such as Colace (over the counter) 100 mg twice a day.  Use MiraLax (over the counter) for constipation as needed.   Diet - low sodium heart healthy  As directed    Discharge instructions  As directed    Comments:     Pick up stool softner and laxative for home. Do not submerge incision under water. May shower. Continue to use ice for pain and  swelling from surgery.  Take Xarelto for two and a half more weeks, then discontinue Xarelto. Once the patient has completed the blood thinner regimen, then take a Baby 81 mg Aspirin daily for four more weeks.   Do not put a pillow under the knee. Place it under the heel.  As directed    Do not sit on low chairs, stoools or toilet seats, as it may be difficult to get up from low surfaces  As directed    Driving restrictions  As directed    Comments:     No driving until released by the physician.   Increase activity slowly as tolerated  As directed    Lifting restrictions  As directed    Comments:     No lifting until released by the physician.   Patient may shower  As directed    Comments:     You may shower without a dressing once there is no drainage.  Do not wash over the wound.  If drainage remains, do not shower until drainage stops.   TED hose  As directed    Comments:     Use stockings (TED hose) for 3 weeks on both leg(s).  You may remove them at night for sleeping.   Weight bearing as tolerated  As directed    Questions:     Laterality:     Extremity:         Medication List    STOP taking these medications       Biotin 10 MG Caps     CALCIUM SOFT CHEWS PO     Co Q 10 100 MG Caps     Fish Oil 1200 MG Caps     ibuprofen 200 MG tablet  Commonly known as:  ADVIL,MOTRIN     meloxicam 15 MG tablet  Commonly known as:  MOBIC     multivitamin tablet      TAKE these medications       busPIRone 5 MG tablet  Commonly known as:  BUSPAR  Take 1 tablet by mouth daily.     diphenhydrAMINE 25 MG tablet  Commonly known as:  BENADRYL  Take 50 mg by mouth every 6 (six) hours as needed.     DULoxetine 60 MG capsule  Commonly known as:  CYMBALTA  Take 60 mg by mouth at bedtime.     fluticasone 50 MCG/ACT nasal spray  Commonly known as:  FLONASE  Place 1 spray into the nose at bedtime.     HYDROmorphone 2 MG tablet  Commonly known as:  DILAUDID  Take 1-2  tablets (2-4 mg total) by mouth every 4 (four) hours as needed for moderate pain.     losartan 50 MG tablet  Commonly known as:  COZAAR  Take 50 mg by mouth at bedtime.     methocarbamol 500 MG tablet  Commonly known as:  ROBAXIN  Take 1 tablet (500 mg total) by mouth every 6 (six) hours as needed for muscle spasms.     rivaroxaban 10 MG Tabs tablet  Commonly known as:  XARELTO  - Take 1 tablet (10 mg total)  by mouth daily with breakfast. Take Xarelto for two and a half more weeks, then discontinue Xarelto.  - Once the patient has completed the blood thinner regimen, then take a Baby 81 mg Aspirin daily for four more weeks.     rosuvastatin 40 MG tablet  Commonly known as:  CRESTOR  Take 40 mg by mouth at bedtime.     traMADol 50 MG tablet  Commonly known as:  ULTRAM  Take 1 tablet (50 mg total) by mouth every 6 (six) hours as needed (mild pain).     zolpidem 5 MG tablet  Commonly known as:  AMBIEN  Take 1 tablet (5 mg total) by mouth at bedtime as needed for sleep.       Follow-up Information   Follow up with [name], MD. Schedule an appointment as soon as possible for a visit on 07/26/2013. (Call [phone] Friday to make the appointment)    Specialty:  Orthopedic Surgery   Contact information:   [address] [place] [address] [phone]       Signed: Patrica Duel 07/28/2013, 10:38 AM

## 2013-07-13 NOTE — Progress Notes (Signed)
Physical Therapy Treatment Patient Details Name: Bianca Ferguson MRN: [id number] DOB: 1951-02-11 Today's Date: 07/13/2013 Time: 1450-1520 PT Time Calculation (min): 30 min  PT Assessment / Plan / Recommendation  History of Present Illness s/p R TKA    PT Comments   POD # 2 pm session.  Assisted pt OOB to amb to BR then amb in hallway.  Back to room then family arrived.  Educated on HEP, use of ICE, positioning and safety using RW.   Follow Up Recommendations  Home health PT     Does the patient have the potential to tolerate intense rehabilitation     Barriers to Discharge        Equipment Recommendations  Rolling walker with 5" wheels    Recommendations for Other Services    Frequency 7X/week   Progress towards PT Goals Progress towards PT goals: Progressing toward goals  Plan Current plan remains appropriate    Precautions / Restrictions Precautions Precautions: Knee Required Braces or Orthoses: Knee Immobilizer - Right Knee Immobilizer - Right: Discontinue once straight leg raise with < 10 degree lag Restrictions Weight Bearing Restrictions: No Other Position/Activity Restrictions: WBAT    Pertinent Vitals/Pain C/o "tightness"   Mobility  Bed Mobility Bed Mobility: Supine to Sit Supine to Sit: 5: Supervision Sit to Supine: 5: Supervision Details for Bed Mobility Assistance: assisted pt OOB  Transfers Transfers: Sit to Stand;Stand to Sit Sit to Stand: 5: Supervision;From bed;From toilet Stand to Sit: 5: Supervision;To toilet;To chair/3-in-1 Details for Transfer Assistance: increased time Ambulation/Gait Ambulation/Gait Assistance: 5: Supervision Ambulation Distance (Feet): 150 Feet Assistive device: Rolling walker Ambulation/Gait Assistance Details: increased time and one VC on safety with backward gait Gait Pattern: Step-through pattern Gait velocity: decreased     PT Goals (current goals can now be found in the care plan section)    Visit  Information  Last PT Received On: 07/13/13 Assistance Needed: +1 History of Present Illness: s/p R TKA     Subjective Data      Cognition       Balance     End of Session PT - End of Session Equipment Utilized During Treatment: Gait belt Activity Tolerance: Patient tolerated treatment well Patient left: in chair CPM Right Knee CPM Right Knee: Off   Felecia Shelling  PTA [facility]  Acute  Rehab Pager      [phone]

## 2013-07-15 NOTE — Progress Notes (Signed)
Discharge summary sent to payer through [facility]  

## 2013-09-23 ENCOUNTER — Encounter (INDEPENDENT_AMBULATORY_CARE_PROVIDER_SITE_OTHER): Payer: PRIVATE HEALTH INSURANCE | Admitting: Ophthalmology

## 2013-09-23 DIAGNOSIS — H43819 Vitreous degeneration, unspecified eye: Secondary | ICD-10-CM

## 2013-09-23 DIAGNOSIS — H35379 Puckering of macula, unspecified eye: Secondary | ICD-10-CM

## 2013-09-23 DIAGNOSIS — I10 Essential (primary) hypertension: Secondary | ICD-10-CM

## 2013-09-23 DIAGNOSIS — H348192 Central retinal vein occlusion, unspecified eye, stable: Secondary | ICD-10-CM

## 2013-09-23 DIAGNOSIS — H2510 Age-related nuclear cataract, unspecified eye: Secondary | ICD-10-CM

## 2013-09-23 DIAGNOSIS — H35039 Hypertensive retinopathy, unspecified eye: Secondary | ICD-10-CM

## 2013-12-16 ENCOUNTER — Encounter (INDEPENDENT_AMBULATORY_CARE_PROVIDER_SITE_OTHER): Payer: PRIVATE HEALTH INSURANCE | Admitting: Ophthalmology

## 2013-12-16 DIAGNOSIS — I10 Essential (primary) hypertension: Secondary | ICD-10-CM

## 2013-12-16 DIAGNOSIS — H348192 Central retinal vein occlusion, unspecified eye, stable: Secondary | ICD-10-CM

## 2013-12-16 DIAGNOSIS — H2510 Age-related nuclear cataract, unspecified eye: Secondary | ICD-10-CM

## 2013-12-16 DIAGNOSIS — H35039 Hypertensive retinopathy, unspecified eye: Secondary | ICD-10-CM

## 2013-12-16 DIAGNOSIS — H43819 Vitreous degeneration, unspecified eye: Secondary | ICD-10-CM

## 2014-01-01 ENCOUNTER — Other Ambulatory Visit: Payer: Self-pay | Admitting: Cardiovascular Disease

## 2014-03-10 ENCOUNTER — Encounter (INDEPENDENT_AMBULATORY_CARE_PROVIDER_SITE_OTHER): Payer: PRIVATE HEALTH INSURANCE | Admitting: Ophthalmology

## 2014-03-10 DIAGNOSIS — H2510 Age-related nuclear cataract, unspecified eye: Secondary | ICD-10-CM

## 2014-03-10 DIAGNOSIS — H43819 Vitreous degeneration, unspecified eye: Secondary | ICD-10-CM

## 2014-03-10 DIAGNOSIS — I10 Essential (primary) hypertension: Secondary | ICD-10-CM

## 2014-03-10 DIAGNOSIS — H35039 Hypertensive retinopathy, unspecified eye: Secondary | ICD-10-CM

## 2014-03-10 DIAGNOSIS — H348192 Central retinal vein occlusion, unspecified eye, stable: Secondary | ICD-10-CM

## 2014-03-10 DIAGNOSIS — H35379 Puckering of macula, unspecified eye: Secondary | ICD-10-CM

## 2014-03-28 IMAGING — CR DG CHEST 2V
2 series · 2 of 2 positions shown · non-contrast
Comparison: 01/01/12

CLINICAL DATA: Preop respiratory exam for right total knee
arthroplasty. Hypertension.

EXAM:
CHEST  2 VIEW

[w chest pa]
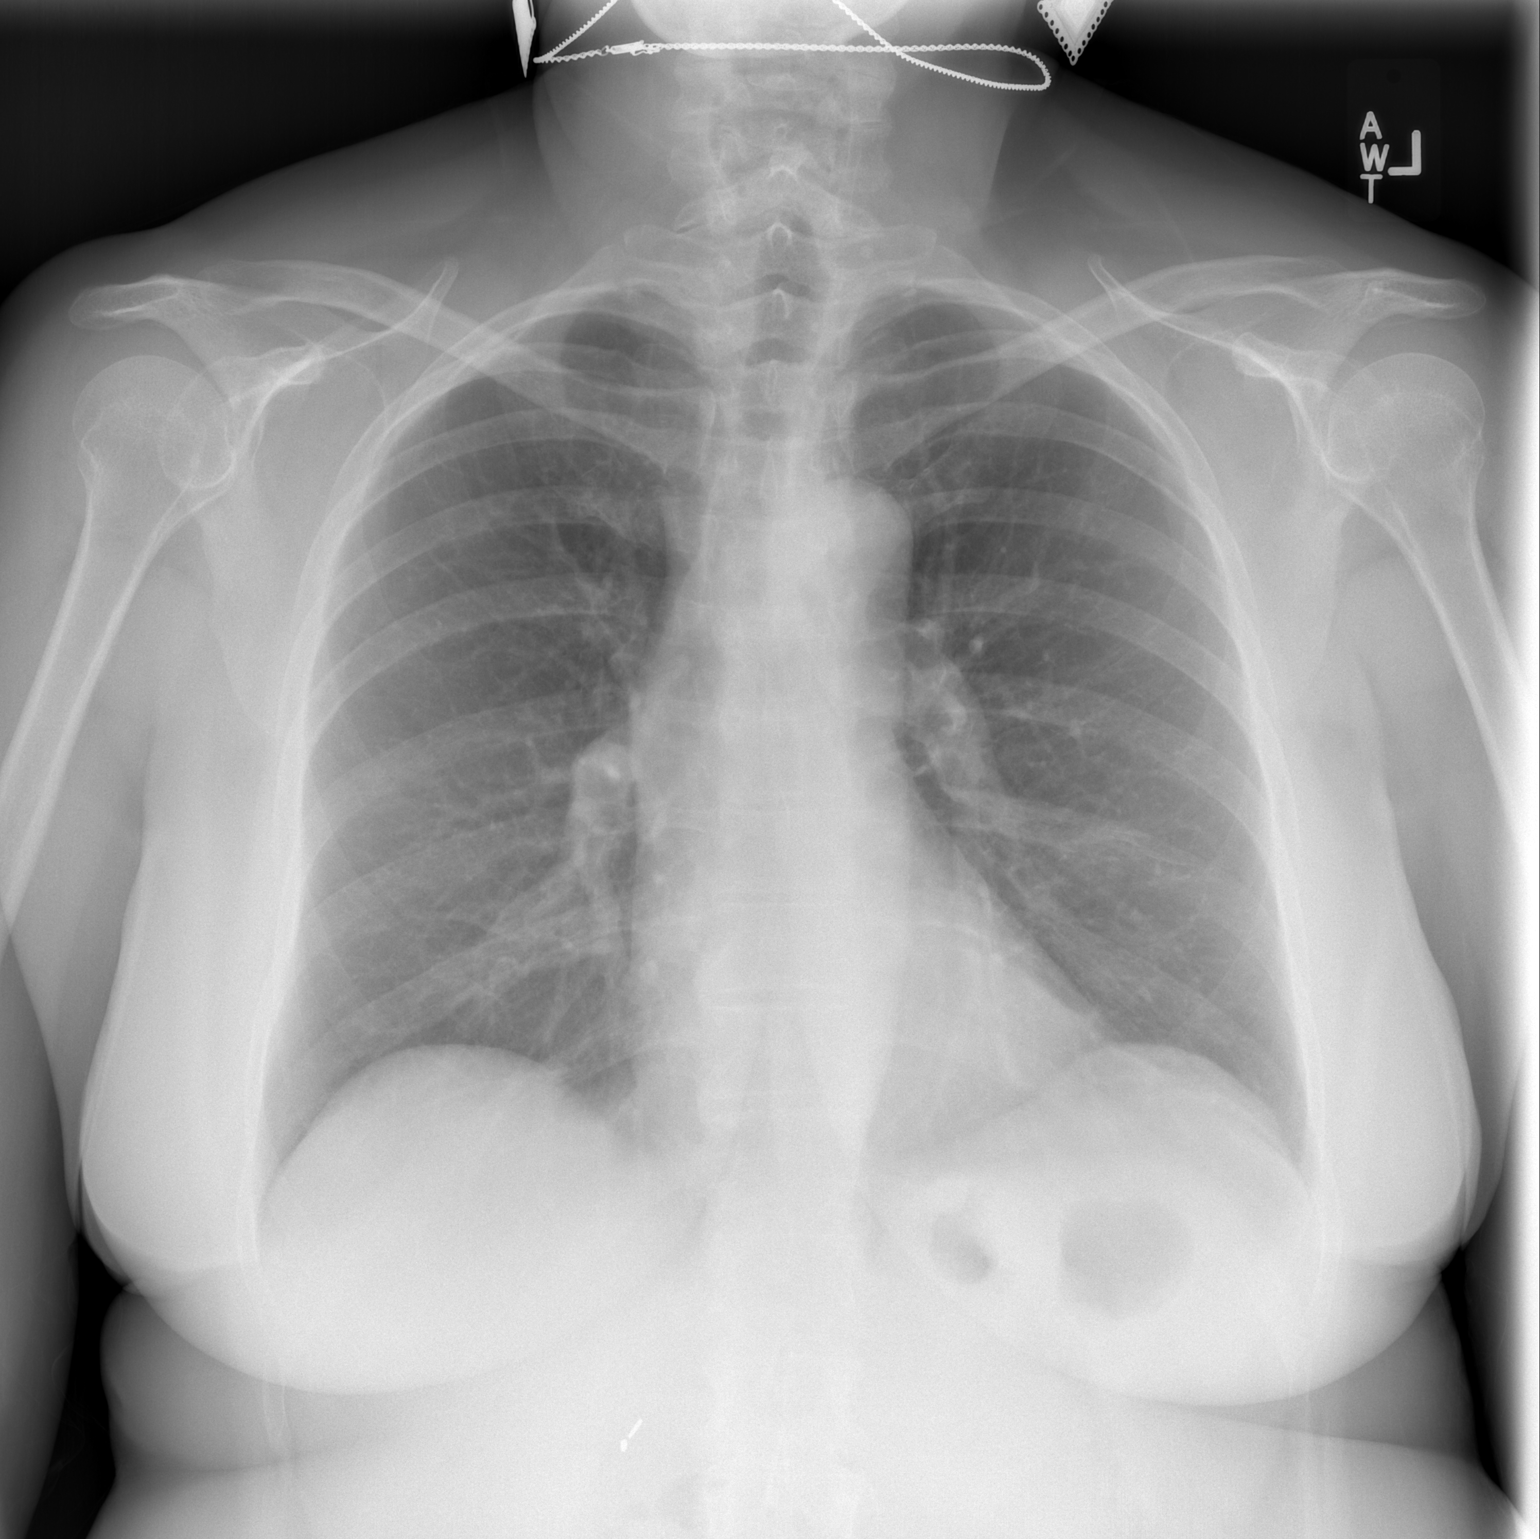

[w chest lat]
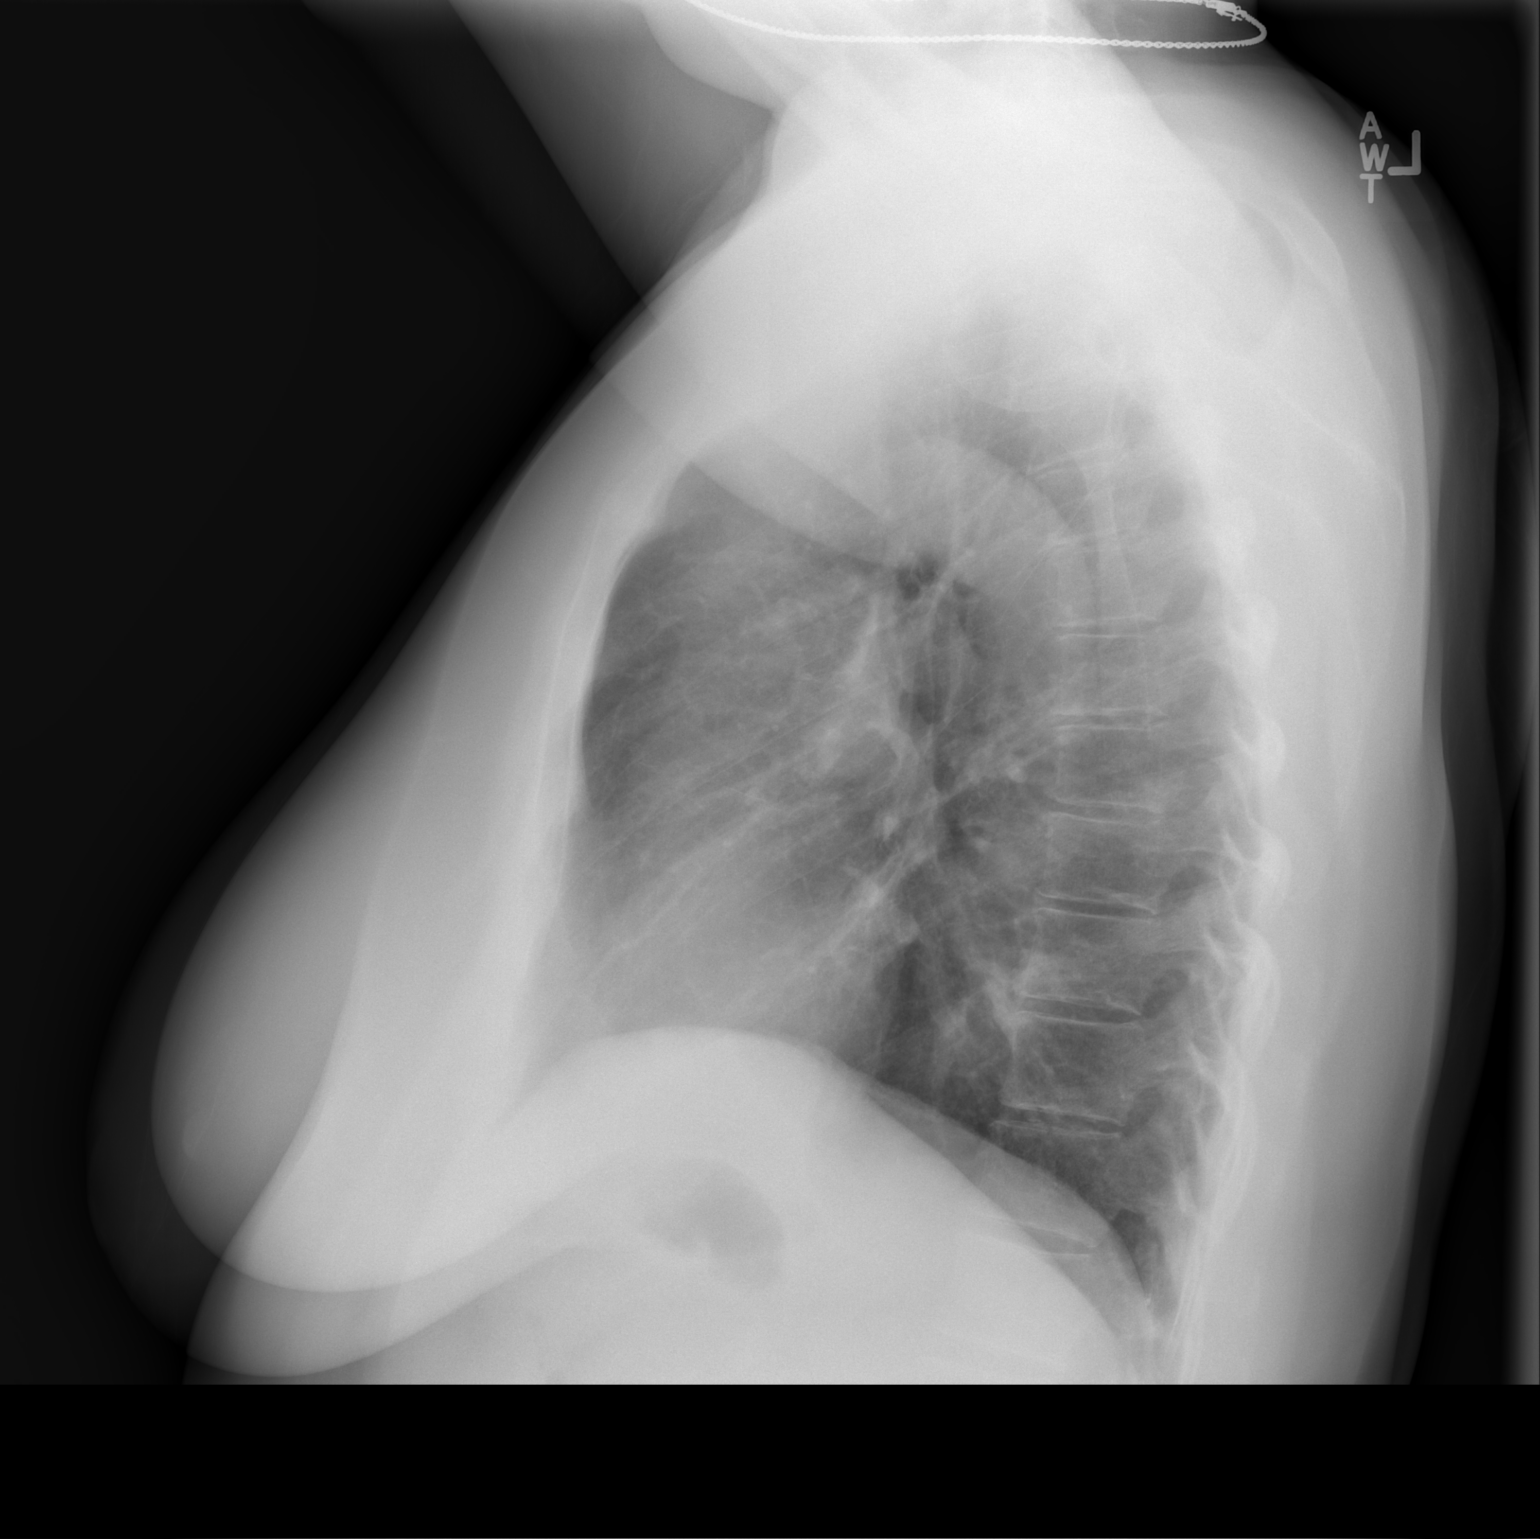

[2 of 2 positions shown; findings below may reference images not displayed]

FINDINGS: The heart size and mediastinal contours are within normal limits.
Both lungs are clear. The visualized skeletal structures are
unremarkable.
IMPRESSION: No active cardiopulmonary disease.

## 2014-06-08 ENCOUNTER — Other Ambulatory Visit: Payer: Self-pay | Admitting: Cardiovascular Disease

## 2014-06-16 ENCOUNTER — Encounter (INDEPENDENT_AMBULATORY_CARE_PROVIDER_SITE_OTHER): Payer: PRIVATE HEALTH INSURANCE | Admitting: Ophthalmology

## 2014-06-21 ENCOUNTER — Encounter (INDEPENDENT_AMBULATORY_CARE_PROVIDER_SITE_OTHER): Payer: PRIVATE HEALTH INSURANCE | Admitting: Ophthalmology

## 2014-06-21 DIAGNOSIS — H43813 Vitreous degeneration, bilateral: Secondary | ICD-10-CM

## 2014-06-21 DIAGNOSIS — H2513 Age-related nuclear cataract, bilateral: Secondary | ICD-10-CM

## 2014-06-21 DIAGNOSIS — H35033 Hypertensive retinopathy, bilateral: Secondary | ICD-10-CM

## 2014-06-21 DIAGNOSIS — H34811 Central retinal vein occlusion, right eye: Secondary | ICD-10-CM

## 2014-06-21 DIAGNOSIS — H35371 Puckering of macula, right eye: Secondary | ICD-10-CM

## 2014-08-23 ENCOUNTER — Encounter (INDEPENDENT_AMBULATORY_CARE_PROVIDER_SITE_OTHER): Payer: PRIVATE HEALTH INSURANCE | Admitting: Ophthalmology

## 2014-08-23 DIAGNOSIS — H43813 Vitreous degeneration, bilateral: Secondary | ICD-10-CM

## 2014-08-23 DIAGNOSIS — I10 Essential (primary) hypertension: Secondary | ICD-10-CM

## 2014-08-23 DIAGNOSIS — H2513 Age-related nuclear cataract, bilateral: Secondary | ICD-10-CM

## 2014-08-23 DIAGNOSIS — H35033 Hypertensive retinopathy, bilateral: Secondary | ICD-10-CM

## 2014-08-23 DIAGNOSIS — H34811 Central retinal vein occlusion, right eye: Secondary | ICD-10-CM

## 2014-11-01 ENCOUNTER — Encounter (INDEPENDENT_AMBULATORY_CARE_PROVIDER_SITE_OTHER): Payer: 59 | Admitting: Ophthalmology

## 2014-11-06 ENCOUNTER — Encounter (INDEPENDENT_AMBULATORY_CARE_PROVIDER_SITE_OTHER): Payer: 59 | Admitting: Ophthalmology

## 2015-01-29 ENCOUNTER — Encounter: Payer: Self-pay | Admitting: *Deleted

## 2015-03-13 ENCOUNTER — Encounter: Payer: Self-pay | Admitting: Cardiovascular Disease

## 2015-04-23 ENCOUNTER — Encounter (INDEPENDENT_AMBULATORY_CARE_PROVIDER_SITE_OTHER): Payer: 59 | Admitting: Ophthalmology

## 2015-04-23 DIAGNOSIS — H348121 Central retinal vein occlusion, left eye, with retinal neovascularization: Secondary | ICD-10-CM | POA: Diagnosis not present

## 2015-04-23 DIAGNOSIS — H43813 Vitreous degeneration, bilateral: Secondary | ICD-10-CM | POA: Diagnosis not present

## 2015-04-23 DIAGNOSIS — H35033 Hypertensive retinopathy, bilateral: Secondary | ICD-10-CM | POA: Diagnosis not present

## 2015-04-23 DIAGNOSIS — I10 Essential (primary) hypertension: Secondary | ICD-10-CM

## 2015-10-29 ENCOUNTER — Ambulatory Visit (INDEPENDENT_AMBULATORY_CARE_PROVIDER_SITE_OTHER): Payer: 59 | Admitting: Ophthalmology

## 2019-06-16 ENCOUNTER — Other Ambulatory Visit: Payer: Self-pay

## 2019-06-16 ENCOUNTER — Ambulatory Visit: Payer: Medicare HMO | Admitting: Cardiovascular Disease

## 2019-06-16 ENCOUNTER — Encounter: Payer: Self-pay | Admitting: Cardiovascular Disease

## 2019-06-16 ENCOUNTER — Encounter

## 2019-06-16 VITALS — BP 130/82 | HR 69 | Temp 97.0°F | Ht 64.0 in | Wt 204.0 lb

## 2019-06-16 DIAGNOSIS — Z79899 Other long term (current) drug therapy: Secondary | ICD-10-CM

## 2019-06-16 DIAGNOSIS — I10 Essential (primary) hypertension: Secondary | ICD-10-CM | POA: Diagnosis not present

## 2019-06-16 DIAGNOSIS — E7849 Other hyperlipidemia: Secondary | ICD-10-CM

## 2019-06-16 DIAGNOSIS — I519 Heart disease, unspecified: Secondary | ICD-10-CM | POA: Diagnosis not present

## 2019-06-16 DIAGNOSIS — I5189 Other ill-defined heart diseases: Secondary | ICD-10-CM

## 2019-06-16 DIAGNOSIS — R0789 Other chest pain: Secondary | ICD-10-CM | POA: Diagnosis not present

## 2019-06-16 DIAGNOSIS — E668 Other obesity: Secondary | ICD-10-CM

## 2019-06-16 MED ORDER — METOPROLOL TARTRATE 50 MG PO TABS: 50.0000 mg | ORAL_TABLET | Freq: Once | ORAL | 0 refills | Status: DC

## 2019-06-16 MED ORDER — LOVASTATIN 40 MG PO TABS: 40.0000 mg | ORAL_TABLET | Freq: Every day | ORAL | 3 refills | Status: DC

## 2019-06-16 MED ORDER — EZETIMIBE 10 MG PO TABS: 10.0000 mg | ORAL_TABLET | Freq: Every day | ORAL | 3 refills | Status: DC

## 2019-06-16 NOTE — Progress Notes (Signed)
Cardiology Office Note    Date:  06/18/2019   ID:  Bianca Ferguson, DOB October 01, 1950, MRN [id number]  PCP:  Lilian Coma., MD  Cardiologist:  Shelva Majestic, MD   New patient to reestablish cardiology care.  Last seen by me 2014.   History of Present Illness:  Bianca Ferguson is a 68 y.o. female who has a history of marked hyperlipidemia as well as hypertension.  In December 2011, I was caring for her mother who was visiting from [place] who was ultimately found to have significant CAD and required intervention.  After her mother's evaluation, I initially saw Bianca Ferguson and at that time had cholesterols greater than 400.  She also had a history of obesity as well as hypertension.  She had lost significant weight.  She was started on aggressive statin therapy.  When I saw her in 2014, she was having episodes of blood pressure lability and was found to have hemorrhage in her right eye with orbital edema treated by Dr. Zigmund Daniel with improvement.  A nuclear perfusion study in March 2014 showed mild anteroapical thinning.  An echo Doppler study in May 2014 showed normal LV function with mild LVH and grade 1 diastolic dysfunction.  Laboratory in March 2014 showed cholesterol 310, triglycerides 232, LDL 201 and at that time she was started back on atorvastatin 80 mg.  Ultimately she had some intolerability to atorvastatin was switched to Crestor.  Follow-up laboratory in December 2014 showed cholesterol 175 HDL 49 and LDL 81 but still had increased triglycerides at 226 and increased VLDL at 45.  At the time she was on losartan 50 mg for blood pressure control and she was seen for preoperative evaluation before undergoing right knee replacement surgery by Dr. Sandie Ano.    I have not seen the patient since 2014.  The patient apparently was hospitalized with diverticular disease on New Year's Eve and ultimately required bowel resection in February 2020.  She subsequently has developed incisional  hernia and is followed by Dr. Yong Channel.  During the Covid pandemic, she has had increased anxiety.  During this time she also admitted to experiencing episodes of chest tightness.  Unfortunately, her mother had done well for many years but recently died on 2019/02/24 and was in [place] for death.  The patient then went up to [place] to settle  her mother's house and while up there did not have any episodes of chest tightness.  The patient apparently has developed significant intolerances in the past to Zocor, atorvastatin, and Crestor.  She was recently started on lovastatin 20 mg by her primary provider she now presents to the office today to reestablish cardiology care.   Past Medical History:  Diagnosis Date   ADD (attention deficit disorder with hyperactivity)    Anxiety    Arthritis    Complication of anesthesia    nausea   Diverticulitis 04/2011   Frequency    GERD (gastroesophageal reflux disease)    Hyperlipidemia    Hypertension    no longer treated bp wnl x 1 year   Retinal vein occlusion    has appointment w Dr. 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   CARDIAC CATHETERIZATION     CHOLECYSTECTOMY     KNEE  ARTHROSCOPY  01/07/2012   Procedure: ARTHROSCOPY KNEE;  Surgeon: Gearlean Alf, MD;  Location: [facility];  Service: Orthopedics;  Laterality: Right;  medial menical debridement, chondroplasty   KNEE ARTHROSCOPY Right 09/08/2012   Procedure: ARTHROSCOPY KNEE;  Surgeon: Gearlean Alf, MD;  Location: [facility];  Service: Orthopedics;  Laterality: Right;  WITH DEBRIDEMENT    TOTAL KNEE ARTHROPLASTY Right 07/11/2013   Procedure: RIGHT TOTAL KNEE ARTHROPLASTY;  Surgeon: Gearlean Alf, MD;  Location: [facility];  Service: Orthopedics;  Laterality: Right;     Current Medications: Outpatient Medications Prior to Visit  Medication Sig Dispense Refill   diphenhydrAMINE (SOMINEX) 25 MG tablet Take 50 mg by mouth at bedtime as needed for Itching or Sleep.     losartan (COZAAR) 100 MG tablet Take 100 mg by mouth daily.     meloxicam (MOBIC) 7.5 MG tablet Take 1 tablet (7.5 mg total) by mouth daily.     lovastatin (MEVACOR) 20 MG tablet Take 20 mg by mouth at bedtime.     busPIRone (BUSPAR) 5 MG tablet Take 1 tablet by mouth daily.     CRESTOR 40 MG tablet TAKE 1 TABLET DAILY (Patient not taking: Reported on 06/16/2019) 90 tablet 0   diphenhydrAMINE (BENADRYL) 25 MG tablet Take 50 mg by mouth every 6 (six) hours as needed.     DULoxetine (CYMBALTA) 60 MG capsule Take 60 mg by mouth at bedtime.     fluticasone (FLONASE) 50 MCG/ACT nasal spray Place 1 spray into the nose at bedtime.     HYDROmorphone (DILAUDID) 2 MG tablet Take 1-2 tablets (2-4 mg total) by mouth every 4 (four) hours as needed for moderate pain. (Patient not taking: Reported on 06/16/2019) 80 tablet 0   losartan (COZAAR) 50 MG tablet Take 50 mg by mouth at bedtime.      methocarbamol (ROBAXIN) 500 MG tablet Take 1 tablet (500 mg total) by mouth every 6 (six) hours as needed for muscle spasms. (Patient not taking: Reported on 06/16/2019) 80 tablet 0   rivaroxaban (XARELTO) 10 MG TABS tablet Take 1 tablet (10 mg total) by mouth daily with breakfast. Take Xarelto for two and a half more weeks, then discontinue Xarelto. Once the patient has completed the blood thinner regimen, then take a Baby 81 mg Aspirin daily for four more weeks. (Patient not taking: Reported on 06/16/2019) 19 tablet 0   rosuvastatin (CRESTOR) 40 MG tablet Take 40 mg by mouth at bedtime.     traMADol (ULTRAM) 50 MG tablet Take 1 tablet (50 mg total) by mouth every 6 (six) hours as needed (mild pain). (Patient not taking: Reported on 06/16/2019) 60 tablet 0   zolpidem (AMBIEN) 5 MG tablet Take 1 tablet (5 mg  total) by mouth at bedtime as needed for sleep. (Patient not taking: Reported on 06/16/2019) 15 tablet 0   No facility-administered medications prior to visit.      Allergies:   Meperidine, Red dye, Codeine, Darvocet [propoxyphene n-acetaminophen], Morphine and related, and Percodan [oxycodone-aspirin]   Social History   Socioeconomic History   Marital status: Married    Spouse name: Not on file   Number of children: Not on file   Years of education: Not on file   Highest education level: Not on file  Occupational History   Not on file  Social Needs   Financial resource strain: Not on file   Food insecurity    Worry: Not on file    Inability: Not on  file   Transportation needs    Medical: Not on file    Non-medical: Not on file  Tobacco Use   Smoking status: Former Smoker    Quit date: 09/03/1998    Years since quitting: 20.8   Smokeless tobacco: Former Systems developer    Quit date: 01/01/1999  Substance and Sexual Activity   Alcohol use: No   Drug use: No   Sexual activity: Not on file  Lifestyle   Physical activity    Days per week: Not on file    Minutes per session: Not on file   Stress: Not on file  Relationships   Social connections    Talks on phone: Not on file    Gets together: Not on file    Attends religious service: Not on file    Active member of club or organization: Not on file    Attends meetings of clubs or organizations: Not on file    Relationship status: Not on file  Other Topics Concern   Not on file  Social History Narrative   Not on file    Additional social history is notable in that she is married for 14 years and is originally from [place].  She has 2 children, and 2 grandchildren.  She is retired.  There is no tobacco history.  She drinks occasional wine.   Family History:  The patient's family history includes CAD in her father; Cerebrovascular Disease in her cousin; Heart attack in her cousin; Heart disease in her mother;  Peripheral vascular disease in her father.   Her mother recently died at age 25.  Father died at age 96 had heart and kidney failure.  She had 1 brother who died with AIDS at age 78.   ROS General: Negative; No fevers, chills, or night sweats;  HEENT: Negative; No changes in vision or hearing, sinus congestion, difficulty swallowing Pulmonary: Negative; No cough, wheezing, shortness of breath, hemoptysis Cardiovascular: See HPI GI: Negative; No nausea, vomiting, diarrhea, or abdominal pain GU: Negative; No dysuria, hematuria, or difficulty voiding Musculoskeletal: Negative; no myalgias, joint pain, or weakness Hematologic/Oncology: Negative; no easy bruising, bleeding Endocrine: Negative; no heat/cold intolerance; no diabetes Neuro: Negative; no changes in balance, headaches Skin: Negative; No rashes or skin lesions Psychiatric: Negative; No behavioral problems, depression Sleep: Negative; No snoring, daytime sleepiness, hypersomnolence, bruxism, restless legs, hypnogognic hallucinations, no cataplexy Other comprehensive 14 point system review is negative.   PHYSICAL EXAM:   VS:  BP 130/82    Pulse 69    Temp (!) 97 F (36.1 C)    Ht 5' 4"  (1.626 m)    Wt 204 lb (92.5 kg)    SpO2 98%    BMI 35.02 kg/m     Repeat blood pressure by me was 118/74  Wt Readings from Last 3 Encounters:  06/16/19 204 lb (92.5 kg)  07/11/13 194 lb (88 kg)  06/30/13 194 lb (88 kg)    General: Alert, oriented, no distress.  Moderate obesity Skin: normal turgor, no rashes, warm and dry HEENT: Normocephalic, atraumatic. Pupils equal round and reactive to light; sclera anicteric; extraocular muscles intact;  Nose without nasal septal hypertrophy Mouth/Parynx benign; Mallinpatti scale 3 Neck: No JVD, no carotid bruits; normal carotid upstroke Lungs: clear to ausculatation and percussion; no wheezing or rales Chest wall: without tenderness to palpitation Heart: PMI not displaced, RRR, s1 s2 normal, 1/6  systolic murmur, no diastolic murmur, no rubs, gallops, thrills, or heaves Abdomen: Incisional hernia; soft, nontender; no  hepatosplenomehaly, BS+; abdominal aorta nontender and not dilated by palpation. Back: no CVA tenderness Pulses 2+ Musculoskeletal: full range of motion, normal strength, no joint deformities Extremities: no clubbing cyanosis or edema, Homan's sign negative  Neurologic: grossly nonfocal; Cranial nerves grossly wnl Psychologic: Normal mood and affect   Studies/Labs Reviewed:   EKG:  EKG is ordered today.  ECG (independently read by me): This rhythm at 69 bpm.  Normal sinus rhythm at 69 bpm.  No ectopy.  Normal intervals.  ECG from December 2014 revealed normal sinus rhythm at 82 bpm with nonspecific ST changes.  Normal intervals  Recent Labs: BMP Latest Ref Rng & Units 07/13/2013 07/12/2013 06/30/2013  Glucose 70 - 99 mg/dL 123(H) 161(H) 83  BUN 6 - 23 mg/dL 10 9 14   Creatinine 0.50 - 1.10 mg/dL 0.50 0.46(L) 0.62  Sodium 137 - 147 mEq/L 137 138 134(L)  Potassium 3.7 - 5.3 mEq/L 3.9 4.1 4.3  Chloride 96 - 112 mEq/L 98 102 97  CO2 19 - 32 mEq/L 27 25 27   Calcium 8.4 - 10.5 mg/dL 8.9 8.7 9.7     Hepatic Function Latest Ref Rng & Units 06/30/2013 06/28/2013 01/03/2013  Total Protein 6.0 - 8.3 g/dL 7.4 6.8 6.6  Albumin 3.5 - 5.2 g/dL 3.9 4.1 4.0  AST 0 - 37 U/L 45(H) 27 27  ALT 0 - 35 U/L 37(H) 27 30  Alk Phosphatase 39 - 117 U/L 111 92 88  Total Bilirubin 0.3 - 1.2 mg/dL 0.5 0.4 0.5    CBC Latest Ref Rng & Units 07/13/2013 07/12/2013 06/30/2013  WBC 4.0 - 10.5 K/uL 13.8(H) 15.4(H) 11.8(H)  Hemoglobin 12.0 - 15.0 g/dL 10.2(L) 10.5(L) 13.2  Hematocrit 36.0 - 46.0 % 31.9(L) 31.6(L) 40.1  Platelets 150 - 400 K/uL 346 326 384   Lab Results  Component Value Date   MCV 84.4 07/13/2013   MCV 83.6 07/12/2013   MCV 83.4 06/30/2013   No results found for: TSH No results found for: HGBA1C   BNP No results found for: BNP  ProBNP No results found for:  PROBNP   Lipid Panel     Component Value Date/Time   CHOL 175 06/28/2013 0839   TRIG 226 (H) 06/28/2013 0839   HDL 49 06/28/2013 0839   CHOLHDL 3.6 06/28/2013 0839   VLDL 45 (H) 06/28/2013 0839   LDLCALC 81 06/28/2013 0839     RADIOLOGY: No results found.   Additional studies/ records that were reviewed today include:   ------------------------------------------------------------  ECHO 12/08/2018 Study Conclusions   - Left ventricle: The cavity size was normal. Wall thickness  was increased in a pattern of mild LVH. Systolic function  was normal. The estimated ejection fraction was in the  range of 55% to 60%. Wall motion was normal; there were no  regional wall motion abnormalities. Doppler parameters are  consistent with abnormal left ventricular relaxation  (grade 1 diastolic dysfunction).  - Mitral valve: Mild regurgitation. Valve area by pressure  half-time: 2.34cm^2.  - Atrial septum: No defect or patent foramen ovale was  identified.    ASSESSMENT:    1. Chest tightness   2. Familial hyperlipidemia   3. Essential hypertension   4. Left ventricular diastolic dysfunction, NYHA class 1   5. Moderate obesity   6. Medication management     PLAN:   Bianca Ferguson is a 68 year old female who has a history of longstanding hypertension and probable familial hyperlipidemia.  Remotely, cholesterols have been in excess of 400 and LDL  cholesterols in excess of 200.  She had derived significant benefit in the past with atorvastatin 80 mg as well as rosuvastatin 40 mg but unfortunately developed significant myalgias leading to ultimate discontinuance.  She also apparently is intolerant to simvastatin.  She was recently started on lovastatin 20 mg.  She seems to be tolerating this low dose.  However, this is a very mild statin and I do not feel it will be adequate at the current dosing to treat her appropriately.  Since she is tolerating 20 mg I have  recommended increasing to 40 mg and also adding Zetia 10 mg.  However, I discussed with her that she may not be able to achieve optimal treatment and for this reason had a lengthy discussion with her regarding potential future PCSK9 inhibition with Repatha.  She had recently developed increasing anxiety caring for her mother with ultimate development of chest tightness during periods of anxiety.  With her longstanding familial hyperlipidemia and her episodes of chest pain I have suggested further evaluation of her coronary anatomy.  In 2014 she had undergone a nuclear perfusion study which revealed probable attenuation defects.  I am scheduling her for coronary CTA with possible FFR for evaluation of coronary plaque.  Her blood pressure today is stable on losartan 100 mg.  I am recommending laboratory be rechecked in 2 months and I will see her back in the office for follow-up evaluation and further recommendations were made at that time.  Medication Adjustments/Labs and Tests Ordered: Current medicines are reviewed at length with the patient today.  Concerns regarding medicines are outlined above.  Medication changes, Labs and Tests ordered today are listed in the Patient Instructions below. Patient Instructions  Medication Instructions:  INCREASE- Lovastatin 40 mg by mouth daily START- Zetia 10 mg by mouth daily TAKE- Metoprolol 50 mg by mouth 2 hours before procedure  *If you need a refill on your cardiac medications before your next appointment, please call your pharmacy*  Lab Work: BMP prior to procedure Fasting Lipid and Liver in 1 Month  If you have labs (blood work) drawn today and your tests are completely normal, you will receive your results only by:  [place] (if you have MyChart) OR  A paper copy in the mail If you have any lab test that is abnormal or we need to change your treatment, we will call you to review the results.  Testing/Procedures: Non-Cardiac CT Angiography  (CTA), is a special type of CT scan that uses a computer to produce multi-dimensional views of major blood vessels throughout the body. In CT angiography, a contrast material is injected through an IV to help visualize the blood vessels   Follow-Up: At [facility], you and your health needs are our priority.  As part of our continuing mission to provide you with exceptional heart care, we have created designated Provider Care Teams.  These Care Teams include your primary Cardiologist (physician) and Advanced Practice Providers (APPs -  Physician Assistants and Nurse Practitioners) who all work together to provide you with the care you need, when you need it.  Your next appointment:   2-3 month(s)  The format for your next appointment:   In Person  Provider:   Shelva Majestic, MD  Other Instructions Your cardiac CT will be scheduled at one of the below locations:   [facility] [address] [place], [place] [address] [phone]  [place],  [place] [address] [phone]  If scheduled at [facility], please arrive at the [facility] main entrance of [facility] 30-45 minutes prior to test start time. Proceed to the [facility] Radiology Department (first floor) to check-in and test prep.  If scheduled at [facility], please arrive 15 mins early for check-in and test prep.  Please follow these instructions carefully (unless otherwise directed):   On the Night Before the Test:  Be sure to Drink plenty of water.  Do not consume any caffeinated/decaffeinated beverages or chocolate 12 hours prior to your test.  Do not take any antihistamines 12 hours prior to your test.   On the Day of the Test:  Drink plenty of water. Do not drink any water within one hour of the test.  Do not eat any food 4 hours prior to the test.  You may take your  regular medications prior to the test.   Take metoprolol (Lopressor) two hours prior to test.  HOLD Furosemide/Hydrochlorothiazide morning of the test.  FEMALES- please wear underwire-free bra if available       After the Test:  Drink plenty of water.  After receiving IV contrast, you may experience a mild flushed feeling. This is normal.  On occasion, you may experience a mild rash up to 24 hours after the test. This is not dangerous. If this occurs, you can take Benadryl 25 mg and increase your fluid intake.  If you experience trouble breathing, this can be serious. If it is severe call 911 IMMEDIATELY. If it is mild, please call our office.  If you take any of these medications: Glipizide/Metformin, Avandament, Glucavance, please do not take 48 hours after completing test unless otherwise instructed.   Once we have confirmed authorization from your insurance company, we will call you to set up a date and time for your test.   For non-scheduling related questions, please contact the cardiac imaging nurse navigator should you have any questions/concerns: Marchia Bond, RN Navigator Cardiac Imaging [facility] [phone] Office        Signed, Shelva Majestic, MD  06/18/2019 5:34 PM    [place] [address], [place], [place], [place]  [address] Phone: [phone]

## 2019-06-16 NOTE — Patient Instructions (Addendum)
Medication Instructions:  INCREASE- Lovastatin 40 mg by mouth daily START- Zetia 10 mg by mouth daily TAKE- Metoprolol 50 mg by mouth 2 hours before procedure  *If you need a refill on your cardiac medications before your next appointment, please call your pharmacy*  Lab Work: BMP prior to procedure Fasting Lipid and Liver in 1 Month  If you have labs (blood work) drawn today and your tests are completely normal, you will receive your results only by: Marland Kitchen MyChart Message (if you have MyChart) OR . A paper copy in the mail If you have any lab test that is abnormal or we need to change your treatment, we will call you to review the results.  Testing/Procedures: Non-Cardiac CT Angiography (CTA), is a special type of CT scan that uses a computer to produce multi-dimensional views of major blood vessels throughout the body. In CT angiography, a contrast material is injected through an IV to help visualize the blood vessels   Follow-Up: At [facility], you and your health needs are our priority.  As part of our continuing mission to provide you with exceptional heart care, we have created designated Provider Care Teams.  These Care Teams include your primary Cardiologist (physician) and Advanced Practice Providers (APPs -  Physician Assistants and Nurse Practitioners) who all work together to provide you with the care you need, when you need it.  Your next appointment:   2-3 month(s)  The format for your next appointment:   In Person  Provider:   Shelva Majestic, MD  Other Instructions Your cardiac CT will be scheduled at one of the below locations:   [facility] [address] [place], [place] [address] [phone]  [place] [address] [place], [place] [address] [phone]  If scheduled at [facility], please arrive at the [facility] main entrance of [facility] 30-45 minutes prior to  test start time. Proceed to the [facility] Radiology Department (first floor) to check-in and test prep.  If scheduled at [facility], please arrive 15 mins early for check-in and test prep.  Please follow these instructions carefully (unless otherwise directed):   On the Night Before the Test: . Be sure to Drink plenty of water. . Do not consume any caffeinated/decaffeinated beverages or chocolate 12 hours prior to your test. . Do not take any antihistamines 12 hours prior to your test.   On the Day of the Test: . Drink plenty of water. Do not drink any water within one hour of the test. . Do not eat any food 4 hours prior to the test. . You may take your regular medications prior to the test.  . Take metoprolol (Lopressor) two hours prior to test. . HOLD Furosemide/Hydrochlorothiazide morning of the test. . FEMALES- please wear underwire-free bra if available       After the Test: . Drink plenty of water. . After receiving IV contrast, you may experience a mild flushed feeling. This is normal. . On occasion, you may experience a mild rash up to 24 hours after the test. This is not dangerous. If this occurs, you can take Benadryl 25 mg and increase your fluid intake. . If you experience trouble breathing, this can be serious. If it is severe call 911 IMMEDIATELY. If it is mild, please call our office. . If you take any of these medications: Glipizide/Metformin, Avandament, Glucavance, please do not take 48 hours after completing  test unless otherwise instructed.   Once we have confirmed authorization from your insurance company, we will call you to set up a date and time for your test.   For non-scheduling related questions, please contact the cardiac imaging nurse navigator should you have any questions/concerns: Rockwell Alexandria, RN Navigator Cardiac Imaging [facility] [phone] Office

## 2019-06-18 ENCOUNTER — Encounter: Payer: Self-pay | Admitting: Cardiovascular Disease

## 2019-06-22 ENCOUNTER — Other Ambulatory Visit: Payer: Self-pay

## 2019-06-22 DIAGNOSIS — I10 Essential (primary) hypertension: Secondary | ICD-10-CM

## 2019-06-22 DIAGNOSIS — I519 Heart disease, unspecified: Secondary | ICD-10-CM

## 2019-06-22 DIAGNOSIS — I5189 Other ill-defined heart diseases: Secondary | ICD-10-CM

## 2019-06-22 DIAGNOSIS — E7849 Other hyperlipidemia: Secondary | ICD-10-CM

## 2019-06-22 DIAGNOSIS — R0789 Other chest pain: Secondary | ICD-10-CM

## 2019-07-27 LAB — COMPREHENSIVE METABOLIC PANEL
ALT: 21 IU/L (ref 0–32)
AST: 23 IU/L (ref 0–40)
Albumin/Globulin Ratio: 1.7 (ref 1.2–2.2)
Albumin: 4.3 g/dL (ref 3.8–4.8)
Alkaline Phosphatase: 87 IU/L (ref 39–117)
BUN/Creatinine Ratio: 28 (ref 12–28)
BUN: 21 mg/dL (ref 8–27)
Bilirubin Total: 0.3 mg/dL (ref 0.0–1.2)
CO2: 23 mmol/L (ref 20–29)
Calcium: 10 mg/dL (ref 8.7–10.3)
Chloride: 103 mmol/L (ref 96–106)
Creatinine, Ser: 0.76 mg/dL (ref 0.57–1.00)
GFR calc Af Amer: 95 mL/min/{1.73_m2} (ref >59)
GFR calc non Af Amer: 82 mL/min/{1.73_m2} (ref >59)
Globulin, Total: 2.5 g/dL (ref 1.5–4.5)
Glucose: 97 mg/dL (ref 65–99)
Potassium: 5 mmol/L (ref 3.5–5.2)
Sodium: 140 mmol/L (ref 134–144)
Total Protein: 6.8 g/dL (ref 6.0–8.5)

## 2019-07-27 LAB — LIPID PANEL
Chol/HDL Ratio: 4.1 ratio (ref 0.0–4.4)
Cholesterol, Total: 191 mg/dL (ref 100–199)
HDL: 47 mg/dL (ref >39)
LDL Chol Calc (NIH): 113 mg/dL — ABNORMAL HIGH (ref 0–99)
Triglycerides: 177 mg/dL — ABNORMAL HIGH (ref 0–149)
VLDL Cholesterol Cal: 31 mg/dL (ref 5–40)

## 2019-07-29 ENCOUNTER — Encounter (HOSPITAL_COMMUNITY): Payer: Self-pay | Admitting: Emergency Medicine

## 2019-07-29 ENCOUNTER — Telehealth (HOSPITAL_COMMUNITY): Payer: Self-pay | Admitting: Emergency Medicine

## 2019-07-29 NOTE — Telephone Encounter (Signed)
Reaching out to patient to offer assistance regarding upcoming cardiac imaging study; pt verbalizes understanding of appt date/time, parking situation and where to check in, pre-test NPO status and medications ordered, and verified current allergies; name and call back number provided for further questions should they arise Lubertha Leite RN Navigator Cardiac Imaging [facility] [phone] office [phone] cell 

## 2019-08-02 ENCOUNTER — Ambulatory Visit (HOSPITAL_COMMUNITY)
Admission: RE | Admit: 2019-08-02 | Discharge: 2019-08-02 | Disposition: A | Discharge status: Home / Self Care | Payer: Medicare HMO | Source: Ambulatory Visit | Attending: Cardiovascular Disease | Admitting: Cardiovascular Disease

## 2019-08-02 ENCOUNTER — Other Ambulatory Visit: Payer: Self-pay

## 2019-08-02 DIAGNOSIS — I25119 Atherosclerotic heart disease of native coronary artery with unspecified angina pectoris: Secondary | ICD-10-CM | POA: Diagnosis not present

## 2019-08-02 DIAGNOSIS — E7849 Other hyperlipidemia: Secondary | ICD-10-CM

## 2019-08-02 DIAGNOSIS — I700 Atherosclerosis of aorta: Secondary | ICD-10-CM | POA: Insufficient documentation

## 2019-08-02 MED ORDER — NITROGLYCERIN 0.4 MG SL SUBL
0.8000 mg | SUBLINGUAL_TABLET | Freq: Once | SUBLINGUAL | Status: AC
  Administered 2019-08-02: 0.8 mg via SUBLINGUAL

## 2019-08-02 MED ORDER — NITROGLYCERIN 0.4 MG SL SUBL
SUBLINGUAL_TABLET | SUBLINGUAL | Status: AC
  Filled 2019-08-02: qty 2

## 2019-08-02 MED ORDER — IOHEXOL 350 MG/ML SOLN
80.0000 mL | Freq: Once | INTRAVENOUS | Status: AC | PRN
  Administered 2019-08-02: 80 mL via INTRAVENOUS

## 2019-08-03 ENCOUNTER — Telehealth: Payer: Self-pay | Admitting: Cardiovascular Disease

## 2019-08-03 DIAGNOSIS — E7849 Other hyperlipidemia: Secondary | ICD-10-CM

## 2019-08-03 NOTE — Telephone Encounter (Signed)
[id number] 

## 2019-08-03 NOTE — Telephone Encounter (Signed)
New Message  Pt called and stated that she wanted someone to go over her results with her.   Please call to discuss

## 2019-08-03 NOTE — Telephone Encounter (Signed)
Pt calling for her CT results. Informed pt that we are waiting for Dr Tresa Endo to review and we will call as soon as we  Receive these notes. Verbalized understanding

## 2019-08-04 NOTE — Telephone Encounter (Signed)
Patient called w/CT results. She voiced understanding. Will also send via MyChart.   Lipid panel ordered to be completed before March 4 visit, so that MD can review and discuss any changes needed. Her LDL was above target of 60 on last labs but patient reports working on diet.

## 2019-08-04 NOTE — Telephone Encounter (Signed)
See my results message.  Mild coronary plaque in the 25 to 49% range in the proximal and mid LAD, third diagonal and mid RCA.Marland Kitchen Also score to 48 placing her in the 89th percentile.  Plan aggressive lipid management with LDL target 60 or below.  Due to the mild stenosis, FFR analysis was not sent

## 2019-09-15 ENCOUNTER — Encounter: Payer: Self-pay | Admitting: Cardiovascular Disease

## 2019-09-15 ENCOUNTER — Other Ambulatory Visit: Payer: Self-pay

## 2019-09-15 ENCOUNTER — Ambulatory Visit: Payer: Medicare HMO | Admitting: Cardiovascular Disease

## 2019-09-15 DIAGNOSIS — I519 Heart disease, unspecified: Secondary | ICD-10-CM

## 2019-09-15 DIAGNOSIS — E7849 Other hyperlipidemia: Secondary | ICD-10-CM | POA: Diagnosis not present

## 2019-09-15 DIAGNOSIS — I10 Essential (primary) hypertension: Secondary | ICD-10-CM

## 2019-09-15 DIAGNOSIS — Z79899 Other long term (current) drug therapy: Secondary | ICD-10-CM

## 2019-09-15 DIAGNOSIS — R931 Abnormal findings on diagnostic imaging of heart and coronary circulation: Secondary | ICD-10-CM

## 2019-09-15 DIAGNOSIS — E668 Other obesity: Secondary | ICD-10-CM | POA: Diagnosis not present

## 2019-09-15 DIAGNOSIS — I5189 Other ill-defined heart diseases: Secondary | ICD-10-CM

## 2019-09-15 MED ORDER — ROSUVASTATIN CALCIUM 20 MG PO TABS: ORAL_TABLET | ORAL | 2 refills | Status: DC

## 2019-09-15 NOTE — Patient Instructions (Signed)
Medication Instructions:  STOP TAKING LOVASTATIN BEGIN TAKING ROSUVASTATIN- FOR THE FIRST 6 WEEKS ONLY TAKE 1/2 TABLET (10MG ) IF YOU TOLERATE THIS, MAY INCREASE TO 1 WHOLE TABLET (20MG ) AND "EXPERIMENT" WITH THIS.-- MAY TAKE 20MG  ONE DAY THEN 10MG  THE NEXT TO SEE WHAT WORKS FOR YOU. *If you need a refill on your cardiac medications before your next appointment, please call your pharmacy*   Lab Work: 3 MONTHS- FASTING LAB WORK CMET LIPID If you have labs (blood work) drawn today and your tests are completely normal, you will receive your results only by: MyChart Message (if you have MyChart) OR . A paper copy in the mail If you have any lab test that is abnormal or we need to change your treatment, we will call you to review the results.  Follow-Up: At [facility], you and your health needs are our priority.  As part of our continuing mission to provide you with exceptional heart care, we have created designated Provider Care Teams.  These Care Teams include your primary Cardiologist (physician) and Advanced Practice Providers (APPs -  Physician Assistants and Nurse Practitioners) who all work together to provide you with the care you need, when you need it.  We recommend signing up for the patient portal called "MyChart".  Sign up information is provided on this After Visit Summary.  MyChart is used to connect with patients for Virtual Visits (Telemedicine).  Patients are able to view lab/test results, encounter notes, upcoming appointments, etc.  Non-urgent messages can be sent to your provider as well.   To learn more about what you can do with MyChart, go to .    Your next appointment:   3-4 month(s)  The format for your next appointment:   In Person  Provider:   , MD

## 2019-09-15 NOTE — Progress Notes (Signed)
Cardiology Office Note    Date:  09/17/2019   ID:  Bianca Ferguson, DOB 1950/11/29, MRN [id number]  PCP:  Bianca Ferguson., MD  Cardiologist:  Bianca Majestic, MD   36-monthfollow-up evaluation  History of Present Illness:  Bianca Stiversis a 69y.o. female who has a history of marked hyperlipidemia as well as hypertension.  In December 2011, I was caring for her [place] found [place] significant CAD and required intervention.  After her mother's evaluation, I initially saw Bianca CBomkampand at that time had cholesterols greater than 400.  She also had a history of obesity as well as hypertension.  She had lost significant weight.  She was started on aggressive statin therapy.  When I saw her in 2014, she was having episodes of blood pressure lability and was found to have hemorrhage in her right eye with orbital edema treated by Dr. MZigmund Danielwith improvement.  A nuclear perfusion study in March 2014 showed mild anteroapical thinning.  An echo Doppler study in May 2014 showed normal LV function with mild LVH and grade 1 diastolic dysfunction.  Laboratory in March 2014 showed cholesterol 310, triglycerides 232, LDL 201 and at that time she was started back on atorvastatin 80 mg.  Ultimately she had some intolerability to atorvastatin was switched to Crestor.  Follow-up laboratory in December 2014 showed cholesterol 175 HDL 49 and LDL 81 but still had increased triglycerides at 226 and increased VLDL at 45.  At the time she was on losartan 50 mg for blood pressure control and she was seen for preoperative evaluation before undergoing right knee replacement surgery by Dr. ASandie Ferguson    I had not seen the patient since 2014 until she represented for evaluation on June 16, 2019.  The patient apparently was hospitalized with diverticular disease on New Year's Eve and ultimately required bowel resection in February 2020.  She subsequently has  developed incisional hernia and is followed by Bianca Ferguson  During the Covid pandemic, she has had increased anxiety.  During this time she also admitted to experiencing episodes of chest tightness.  Unfortunately, her mother had done well for many years but died on [place], [place]  [place] and while up there did experience mild episodes of chest tightness during periods of increased anxiety.  The patient apparently has developed significant intolerances in the past to Zocor, atorvastatin, and Crestor.  She was recently started on lovastatin 20 mg by her primary provider she presented to the office in December 2020 to reestablish cardiology care.  When I saw her for reevaluation she was tolerating low-dose lovastatin and I recommended further titration to 40 mg and added Zetia 10 mg.  However discussed with her that if arrival to obtain aggressive treatment he may be a candidate for future PCSK9 inhibition with Repatha.  With her longstanding familial hyperlipidemia and episodes of anxiety mediated chest discomfort I recommended she undergo coronary CTA with possible FFR for further evaluation.   She apparently underwent incision  hernia surgery on June 22, 2019 by Dr. HYong Channelwith insertion of a mesh.  Abdominal discomfort has significantly improved.   CTA was done on August 02, 2019 which showed a calcium score of 248, 89th percentile for age and sex matched control.  She had mild 25 to 49% mixed plaque stenosis in  the proximal LAD, mild 25 to 49% calcified stenosis in the mid LAD, proximal third diagonal and mid RCA.  She has undergone subsequent lipid studies on July 26, 2019 which showed a total cholesterol 191, triglycerides 177, LDL 113.  She presents for evaluation.    Past Medical History:  Diagnosis Date  . ADD (attention deficit disorder with hyperactivity)   . Anxiety   . Arthritis   .  Complication of anesthesia    nausea  . Diverticulitis 04/2011  . Frequency   . GERD (gastroesophageal reflux disease)   . Hyperlipidemia   . Hypertension    no longer treated bp wnl x 1 year  . Retinal vein occlusion    has appointment w Bianca Ferguson 2/24    Past Surgical History:  Procedure Laterality Date  . ABDOMINAL HYSTERECTOMY    . APPENDECTOMY    . BACK SURGERY    . 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 CARDIAC CATHETERIZATION    . CHOLECYSTECTOMY    . KNEE ARTHROSCOPY  01/07/2012   Procedure: ARTHROSCOPY KNEE;  Surgeon: Bianca Alf, MD;  Location: [facility];  Service: Orthopedics;  Laterality: Right;  medial menical debridement, chondroplasty  . KNEE ARTHROSCOPY Right 09/08/2012   Procedure: ARTHROSCOPY KNEE;  Surgeon: Bianca Alf, MD;  [facility];  Service: Orthopedics;  Laterality: Right;  WITH DEBRIDEMENT   . TOTAL KNEE ARTHROPLASTY Right 07/11/2013   Procedure: RIGHT TOTAL KNEE ARTHROPLASTY;  Surgeon: Bianca Alf, MD;  Location: [facility];  Service: Orthopedics;  Laterality: Right;    Current Medications: Outpatient Medications Prior to Visit  Medication Sig Dispense Refill  . losartan (COZAAR) 100 MG tablet Take 100 mg by mouth daily.    . meloxicam (MOBIC) 7.5 MG tablet Take 1 tablet (7.5 mg total) by mouth daily.    Marland Kitchen lovastatin (MEVACOR) 40 MG tablet Take 1 tablet (40 mg total) by mouth at bedtime. 90 tablet 3  . ezetimibe (ZETIA) 10 MG tablet Take 1 tablet (10 mg total) by mouth daily. 90 tablet 3  . diphenhydrAMINE (SOMINEX) 25 MG tablet Take 50 mg by mouth at bedtime as needed for Itching or Sleep.    . metoprolol tartrate (LOPRESSOR) 50 MG tablet Take 1 tablet (50 mg total) by mouth once for 1 dose. 1 tablet 0   No facility-administered medications prior to visit.     Allergies:   Meperidine, Red dye, Codeine, Darvocet [propoxyphene n-acetaminophen], Morphine and related, and  Percodan [oxycodone-aspirin]   Social History   Socioeconomic History  . Marital status: Married    Spouse name: Not on file  . Number of children: Not on file  . Years of education: Not on file  . Highest education level: Not on file  Occupational History  . Not on file  Tobacco Use  . Smoking status: Former Smoker    Quit date: 09/03/1998    Years since quitting: 21.0  . Smokeless tobacco: Former Systems developer    Quit date: 01/01/1999  Substance and Sexual Activity  . Alcohol use: No  . Drug use: No  . Sexual activity: Not on file  Other Topics Concern  . Not on file  Social History Narrative  . Not on file   Social Determinants of Health   Financial Resource Strain:   . Difficulty of Paying Living Expenses: Not on file  Food Insecurity:   . Worried About Charity fundraiser in  the Last Year: Not on file  . Ran Out of Food in the Last Year: Not on file  Transportation Needs:   . Lack of Transportation (Medical): Not on file  . Lack of Transportation (Non-Medical): Not on file  Physical Activity:   . Days of Exercise per Week: Not on file  . Minutes of Exercise per Session: Not on file  Stress:   . Feeling of Stress : Not on file  Social Connections:   . Frequency of Communication with Friends and Family: Not on file  . Frequency of Social Gatherings with Friends and Family: Not on file  . Attends Religious Services: Not on file  . Active Member of Clubs or Organizations: Not on file  . Attends Archivist Meetings: Not on file  . Marital Status: Not on file    Additional social history is notable in that she is married for 68 years and is originally from [place].  She has 2 children, and 2 grandchildren.  She is retired.  There is no tobacco history.  She drinks occasional wine.   Family History:  The patient's family history includes CAD in her father; Cerebrovascular Disease in her cousin; Heart attack in her cousin; Heart disease in her mother; Peripheral  vascular disease in her father.   Her mother recently died at age 74.  Father died at age 20 had heart and kidney failure.  She had 1 brother who died with AIDS at age 22.   ROS General: Negative; No fevers, chills, or night sweats;  HEENT: Negative; No changes in vision or hearing, sinus congestion, difficulty swallowing Pulmonary: Negative; No cough, wheezing, shortness of breath, hemoptysis Cardiovascular: See HPI GI: Status post incisional hernia surgery GU: Negative; No dysuria, hematuria, or difficulty voiding Musculoskeletal: Negative; no myalgias, joint pain, or weakness Hematologic/Oncology: Negative; no easy bruising, bleeding Endocrine: Negative; no heat/cold intolerance; no diabetes Neuro: Negative; no changes in balance, headaches Skin: Negative; No rashes or skin lesions Psychiatric: Negative; No behavioral problems, depression Sleep: Negative; No snoring, daytime sleepiness, hypersomnolence, bruxism, restless legs, hypnogognic hallucinations, no cataplexy Other comprehensive 14 point system review is negative.   PHYSICAL EXAM:   VS:  BP 122/74   Pulse 78   Ht 5' 4"  (1.626 m)   Wt 208 lb 6.4 oz (94.5 kg)   SpO2 97%   BMI 35.77 kg/m     Repeat blood pressure 124/76  Wt Readings from Last 3 Encounters:  09/15/19 208 lb 6.4 oz (94.5 kg)  06/16/19 204 lb (92.5 kg)  07/11/13 194 lb (88 kg)    General: Alert, oriented, no distress.  Skin: normal turgor, no rashes, warm and dry HEENT: Normocephalic, atraumatic. Pupils equal round and reactive to light; sclera anicteric; extraocular muscles intact; Nose without nasal septal hypertrophy Mouth/Parynx benign; Mallinpatti scale 3 Neck: No JVD, no carotid bruits; normal carotid upstroke Lungs: clear to ausculatation and percussion; no wheezing or rales Chest wall: without tenderness to palpitation Heart: PMI not displaced, RRR, s1 s2 normal, 1/6 systolic murmur, no diastolic murmur, no rubs, gallops, thrills, or  heaves Abdomen: soft, nontender; no hepatosplenomehaly, BS+; abdominal aorta nontender and not dilated by palpation. Back: no CVA tenderness Pulses 2+ Musculoskeletal: full range of motion, normal strength, no joint deformities Extremities: no clubbing cyanosis or edema, Homan's sign negative  Neurologic: grossly nonfocal; Cranial nerves grossly wnl Psychologic: Normal mood and affect   Studies/Labs Reviewed:   EKG:  EKG is ordered today.  ECG (independently read by me):  Normal sinus rhythm at 73 bpm.  Q-wave in lead III.  No ectopy.  Normal intervals.  June 16, 2019 ECG (independently read by me): This rhythm at 69 bpm.  Normal sinus rhythm at 69 bpm.  No ectopy.  Normal intervals.  ECG from December 2014 revealed normal sinus rhythm at 82 bpm with nonspecific ST changes.  Normal intervals  Recent Labs: BMP Latest Ref Rng & Units 07/26/2019 07/13/2013 07/12/2013  Glucose 65 - 99 mg/dL 97 123(H) 161(H)  BUN 8 - 27 mg/dL 21 10 9   Creatinine 0.57 - 1.00 mg/dL 0.76 0.50 0.46(L)  BUN/Creat Ratio 12 - 28 28 - -  Sodium 134 - 144 mmol/L 140 137 138  Potassium 3.5 - 5.2 mmol/L 5.0 3.9 4.1  Chloride 96 - 106 mmol/L 103 98 102  CO2 20 - 29 mmol/L 23 27 25   Calcium 8.7 - 10.3 mg/dL 10.0 8.9 8.7     Hepatic Function Latest Ref Rng & Units 07/26/2019 06/30/2013 06/28/2013  Total Protein 6.0 - 8.5 g/dL 6.8 7.4 6.8  Albumin 3.8 - 4.8 g/dL 4.3 3.9 4.1  AST 0 - 40 IU/L 23 45(H) 27  ALT 0 - 32 IU/L 21 37(H) 27  Alk Phosphatase 39 - 117 IU/L 87 111 92  Total Bilirubin 0.0 - 1.2 mg/dL 0.3 0.5 0.4    CBC Latest Ref Rng & Units 07/13/2013 07/12/2013 06/30/2013  WBC 4.0 - 10.5 K/uL 13.8(H) 15.4(H) 11.8(H)  Hemoglobin 12.0 - 15.0 g/dL 10.2(L) 10.5(L) 13.2  Hematocrit 36.0 - 46.0 % 31.9(L) 31.6(L) 40.1  Platelets 150 - 400 K/uL 346 326 384   Lab Results  Component Value Date   MCV 84.4 07/13/2013   MCV 83.6 07/12/2013   MCV 83.4 06/30/2013   No results found for: TSH No results found  for: HGBA1C   BNP No results found for: BNP  ProBNP No results found for: PROBNP   Lipid Panel     Component Value Date/Time   CHOL 191 07/26/2019 0843   TRIG 177 (H) 07/26/2019 0843   HDL 47 07/26/2019 0843   CHOLHDL 4.1 07/26/2019 0843   CHOLHDL 3.6 06/28/2013 0839   VLDL 45 (H) 06/28/2013 0839   LDLCALC 113 (H) 07/26/2019 0843   LABVLDL 31 07/26/2019 0843     RADIOLOGY: No results found.   Additional studies/ records that were reviewed today include:   ------------------------------------------------------------  ECHO 12/08/2018 Study Conclusions   - Left ventricle: The cavity size was normal. Wall thickness  was increased in a pattern of mild LVH. Systolic function  was normal. The estimated ejection fraction was in the  range of 55% to 60%. Wall motion was normal; there were no  regional wall motion abnormalities. Doppler parameters are  consistent with abnormal left ventricular relaxation  (grade 1 diastolic dysfunction).  - Mitral valve: Mild regurgitation. Valve area by pressure  half-time: 2.34cm^2.  - Atrial septum: No defect or patent foramen ovale was  identified.   CTA CORONARY IMPRESSION: 08/02/2019 1. Coronary calcium score of 248. This was 5 percentile for age and sex matched control.  2. Normal coronary origin with right dominance.  3. Mild (25-49%) mixed plaque stenosis in the proximal LAD; mild (25-49%) calcified stenosis in the mid LAD, proximal D3 and mid RCA; CADRADS-2.  ASSESSMENT:    1. Elevated coronary artery calcium score   2. Familial hyperlipidemia   3. Essential hypertension   4. Moderate obesity   5. Left ventricular diastolic dysfunction, NYHA class 1   6.  Medication management     PLAN:   Bianca. Nichoel Digiulio is a 69 year old female who has a history of longstanding hypertension and probable familial hyperlipidemia.  Remotely, cholesterols have been in excess of 400 and LDL cholesterols in  excess of 200.  She had derived significant benefit in the past with atorvastatin 80 mg as well as rosuvastatin 40 mg but unfortunately developed significant myalgias leading to ultimate discontinuance.  She also  is intolerant to simvastatin.  She was recently started on lovastatin 20 mg.  When I saw her on June 16, 2019, losartan dose was titrated to 40 mg and she was started on Zetia.  Subsequent lipid studies show improvement with LDL cholesterol still elevated but now at 113.  I reviewed her coronary CTA study with her in detail.  Calcium score is 248 placing her in the [employer] for age and sex matched controls.  She had mild to moderate CAD not significantly obstructive with stenoses less than 49%.  Aggressive medical therapy strategy is initially recommended.  I will now try discontinuing lovastatin and initiate a trial of low-dose rosuvastatin 10 mg with coenzyme every 10 supplementation and continuation of Zetia 10 mg.  In 2 months, I am recommending a follow-up chemistry profile and lipid studies.  Target LDL is less than 70 in this patient with coronary hallucinosis.  If she cannot reach target due to ability to further titrate statins she may be a candidate for PCSK9 inhibition.  BMI is 35.77 consistent with moderate obesity.  Weight loss and increased exercise was recommended.  Her blood pressure today is stable and on repeat by me 124/76 on her medical regimen consisting of losartan 100 mg daily.  Laboratory reveals stable renal function with a creatinine of 0.76.  Most recent lipid panel continue to show mild elevation of triglycerides 117, improved from previous.  We discussed Mediterranean diet, reduction in carbohydrates and sweets.  I will see her in 3 months for reevaluation or sooner as needed.   Medication Adjustments/Labs and Tests Ordered: Current medicines are reviewed at length with the patient today.  Concerns regarding medicines are outlined above.  Medication changes, Labs and  Tests ordered today are listed in the Patient Instructions below. Patient Instructions  Medication Instructions:  STOP TAKING LOVASTATIN BEGIN TAKING ROSUVASTATIN- FOR THE FIRST 6 WEEKS ONLY TAKE 1/2 TABLET (10MG) IF YOU TOLERATE THIS, MAY INCREASE TO 1 WHOLE TABLET (20MG) AND "EXPERIMENT" WITH THIS.-- MAY TAKE 20MG ONE DAY THEN 10MG THE NEXT TO SEE WHAT WORKS FOR YOU. *If you need a refill on your cardiac medications before your next appointment, please call your pharmacy*   Lab Work: 3 MONTHS- FASTING LAB WORK CMET LIPID If you have labs (blood work) drawn today and your tests are completely normal, you will receive your results only by: Marland Kitchen MyChart Message (if you have MyChart) OR . A paper copy in the mail If you have any lab test that is abnormal or we need to change your treatment, we will call you to review the results.  Follow-Up: At [facility], you and your health needs are our priority.  As part of our continuing mission to provide you with exceptional heart care, we have created designated Provider Care Teams.  These Care Teams include your primary Cardiologist (physician) and Advanced Practice Providers (APPs -  Physician Assistants and Nurse Practitioners) who all work together to provide you with the care you need, when you need it.  We recommend signing up for the  patient portal called "MyChart".  Sign up information is provided on this After Visit Summary.  MyChart is used to connect with patients for Virtual Visits (Telemedicine).  Patients are able to view lab/test results, encounter notes, upcoming appointments, etc.  Non-urgent messages can be sent to your provider as well.   To learn more about what you can do with MyChart, go to [email or web address].    Your next appointment:   3-4 month(s)  The format for your next appointment:   In Person  Provider:   Shelva Majestic, MD      Signed, Bianca Majestic, MD  09/17/2019 10:31 AM    [facility] [address] [place], [place], [place]  [address] Phone: [phone]

## 2019-09-17 ENCOUNTER — Encounter: Payer: Self-pay | Admitting: Cardiovascular Disease

## 2019-10-18 NOTE — Telephone Encounter (Signed)
Zollie Scale  Based on his last note, she will need to start Repatha.  For this she will need to come in and see either Raquel or I.  Please have scheduling call her to set up an appointment with [facility] for sometime in the next month

## 2019-10-24 ENCOUNTER — Ambulatory Visit (INDEPENDENT_AMBULATORY_CARE_PROVIDER_SITE_OTHER): Payer: Medicare HMO | Admitting: Pharmacist

## 2019-10-24 ENCOUNTER — Other Ambulatory Visit: Payer: Self-pay

## 2019-10-24 VITALS — BP 130/82 | HR 90 | Resp 16 | Ht 64.0 in | Wt 211.4 lb

## 2019-10-24 DIAGNOSIS — G720 Drug-induced myopathy: Secondary | ICD-10-CM | POA: Insufficient documentation

## 2019-10-24 DIAGNOSIS — T466X5A Adverse effect of antihyperlipidemic and antiarteriosclerotic drugs, initial encounter: Secondary | ICD-10-CM | POA: Diagnosis not present

## 2019-10-24 DIAGNOSIS — E785 Hyperlipidemia, unspecified: Secondary | ICD-10-CM | POA: Diagnosis not present

## 2019-10-24 NOTE — Progress Notes (Signed)
Patient ID: Bianca Ferguson                 DOB: 08-25-1950                    MRN: [id number]     HPI: Bianca Ferguson is a 69 y.o. female patient referred to lipid clinic by Dr Tresa Endo. PMH is significant for hypertension, hyperlipidemia, coronary calcium score of 248 (89 percentile), and possible familial hyperlipidemia with LDL of 201 on 09/02/2012 and strong family history of cardiovascular disease.  Noted failed trial with simvastatin, atorvastatin,lovastatin and rosuvastatin. Patient continues to take ezetimibe 10mg  daily but all statins cause severe cramps and pain. She presents today for potential PCSK9i initiation and counseling.  Current Medications:  Ezetimibe 10mg  daily  Intolerances: Atorvastatin 80mg  Rosuvastatin 40mg  Rosuvastatin 20mg  rosuvastatin 5mg  - cramps, legs, foot, and diaphragm spasms Lovastatin 40mg   LDL goal: 70mg /dL  Diet: patient started to cut downs fats, and sugars  Exercise: recently walking ~10, 000 steps per day  Family History: The patient's family history includes CAD in her father; Cerebrovascular Disease in her cousin; Heart attack in her cousin; Heart disease in her mother; Peripheral vascular disease in her father 42's  Social History: Denis tobacco use. Drinks occasional wine.  Labs: 07/26/2019: CHO 191; TG 177; HDL 47; LDL-c 113 (low dose rosuvastatin and ezetimibe 10mg ) 09/02/2012: CHO 310; TG 232; HDL 63; LDL-c 201    Past Medical History:  Diagnosis Date  . ADD (attention deficit disorder with hyperactivity)   . Anxiety   . Arthritis   . Complication of anesthesia    nausea  . Diverticulitis 04/2011  . Frequency   . GERD (gastroesophageal reflux disease)   . Hyperlipidemia   . Hypertension    no longer treated bp wnl x 1 year  . Retinal vein occlusion    has appointment w Dr. 2/24    Current Outpatient Medications on File Prior to Visit  Medication Sig Dispense Refill  . losartan (COZAAR) 100 MG tablet Take  100 mg by mouth daily.    . meloxicam (MOBIC) 7.5 MG tablet Take 1 tablet (7.5 mg total) by mouth daily.     No current facility-administered medications on file prior to visit.    Allergies  Allergen Reactions  . Meperidine Itching    Can take with Benadryl Can take with Benadryl Can take with Benadryl Can take with Benadryl   . Red Dye Itching  . Codeine Itching    " all pain meds" needs benadryl with them  . Darvocet [Propoxyphene N-Acetaminophen] Hives  . Morphine And Related Itching  . Percodan [Oxycodone-Aspirin] Hives    1970's rash  . Statins     Hyperlipidemia LDL remains above goal on patient with familial hyperlipidemia and 89 percentile calcium score for age and gender.  Patient is unable to tolerate statins with failed trials to atorvastatin, rosuvastatin, and lovastatin. She continues to take ezetimibe 10mg  daily and is working on positive lifestyle modifications.  We discussed PCSK9i therapy including Repatha and Praluent.  MOA, administration, storage, prior-authorization process, patient assistance program, common side effects, and monitoring were discussed during this office visit.   Will start Praluent (insurance preference) 150mg  every 14 days and follow up fasting lipid panel around the 5th injection. Plan to follow up with Lipid Clinic as needed and with DR in 3 months.   [name] PharmD, BCPS, CPP [facility] [address] [place] 09/04/2012 10/26/2019 8:37  AM

## 2019-10-24 NOTE — Patient Instructions (Addendum)
Your Results:             Your most recent labs Goal  Total Cholesterol 191 < 200  Triglycerides 177 < 150  HDL (happy/good cholesterol) 47 > 40  LDL (lousy/bad cholesterol 113 < 70     Medication changes: *START prior authorization for Praluent 150mg  every 14 days* *START taking Fish-Oil 1 gram twice daily if able to torelate*  Lab orders: none   Patient Assistance:  The [employer] foundation offers assistance to help pay for medication copays.  They will cover copays for all cholesterol lowering meds, including statins, fibrates, omega-3 oils, ezetimibe, Repatha, Praluent, Nexletol, Nexlizet.  The cards are usually good for $2,500 or 12 months, whichever comes first. 1. Go to healthwellfoundation.org 2. Click on "Apply Now" 3. Answer questions as to whom is applying (patient or representative) 4. Your disease fund will be "hypercholesterolemia - Medicare access" 5. They will ask questions about finances and which medications you are taking for cholesterol 6. When you submit, the approval is usually within minutes.  You will need to print the card information from the site 7. You will need to show this information to your pharmacy, they will bill your Medicare Part D plan first -then bill [employer] --for the copay.   You can also call them at [phone], although the hold times can be quite long.   Thank you for choosing [facility]

## 2019-10-25 ENCOUNTER — Other Ambulatory Visit: Payer: Self-pay

## 2019-10-25 ENCOUNTER — Telehealth: Payer: Self-pay

## 2019-10-25 MED ORDER — PRALUENT 150 MG/ML ~~LOC~~ SOAJ: 150.0000 mg | SUBCUTANEOUS | 11 refills | Status: DC

## 2019-10-25 NOTE — Telephone Encounter (Signed)
Called and instructed the pt to start praluent 150, rx sent, pt instructed to call back if unaffordable.

## 2019-10-26 ENCOUNTER — Encounter: Payer: Self-pay | Admitting: Pharmacist

## 2019-10-26 MED ORDER — EZETIMIBE 10 MG PO TABS: 10.0000 mg | ORAL_TABLET | Freq: Every day | ORAL | 3 refills | Status: DC

## 2019-10-26 NOTE — Assessment & Plan Note (Signed)
LDL remains above goal on patient with familial hyperlipidemia and 89 percentile calcium score for age and gender.  Patient is unable to tolerate statins with failed trials to atorvastatin, rosuvastatin, and lovastatin. She continues to take ezetimibe 10mg  daily and is working on positive lifestyle modifications.  We discussed PCSK9i therapy including Repatha and Praluent.  MOA, administration, storage, prior-authorization process, patient assistance program, common side effects, and monitoring were discussed during this office visit.   Will start Praluent (insurance preference) 150mg  every 14 days and follow up fasting lipid panel around the 5th injection. Plan to follow up with [facility] as needed and with DR in 3 months.

## 2019-10-31 ENCOUNTER — Telehealth: Payer: Self-pay | Admitting: Cardiovascular Disease

## 2019-10-31 NOTE — Telephone Encounter (Signed)
Left message for patient to call and schedule July appointment with Dr. Tresa Endo

## 2019-12-19 ENCOUNTER — Ambulatory Visit: Payer: Medicare HMO | Admitting: Cardiovascular Disease

## 2020-01-23 ENCOUNTER — Ambulatory Visit: Payer: Medicare HMO | Admitting: Cardiovascular Disease

## 2020-03-07 ENCOUNTER — Ambulatory Visit: Payer: Medicare HMO | Admitting: Physician Assistant

## 2020-03-07 ENCOUNTER — Other Ambulatory Visit: Payer: Self-pay

## 2020-03-07 ENCOUNTER — Encounter: Payer: Self-pay | Admitting: Physician Assistant

## 2020-03-07 VITALS — BP 140/80 | HR 70 | Ht 64.0 in | Wt 203.4 lb

## 2020-03-07 DIAGNOSIS — I2510 Atherosclerotic heart disease of native coronary artery without angina pectoris: Secondary | ICD-10-CM

## 2020-03-07 DIAGNOSIS — E785 Hyperlipidemia, unspecified: Secondary | ICD-10-CM

## 2020-03-07 DIAGNOSIS — I10 Essential (primary) hypertension: Secondary | ICD-10-CM

## 2020-03-07 MED ORDER — HYDROCORTISONE 1 % EX LOTN: 1.0000 "application " | TOPICAL_LOTION | Freq: Two times a day (BID) | CUTANEOUS | 0 refills | Status: AC

## 2020-03-07 NOTE — Progress Notes (Signed)
Cardiology Office Note:    Date:  03/10/2020   ID:  Bianca Ferguson, DOB 08-21-1950, MRN [id number]  PCP:  Bianca Schaumann., MD  [facility] Cardiologist:  Nicki Guadalajara, MD  [facility] Electrophysiologist:  None   Referring MD: Bianca Schaumann.,*   Chief Complaint  Patient presents with  . Follow-up    seen for Dr. Tresa Endo    History of Present Illness:    Bianca Ferguson is a 69 y.o. female with a hx of obesity, CAD, hypertension and hyperlipidemia.  She has significant family history of CAD.  Her cholesterol improved with aggressive statin therapy and weight loss.  Unfortunately she has developed significant intolerance of Zocor, Lipitor and Crestor.  Lovastatin was later started.  Myoview March 2014 showed a mild anterior apical thinning.  Echocardiogram in May 2014 showed normal LV function with mild LVH, grade 1 DD.  Coronary CT obtained on 08/02/2019 showed calcium score of 248, which placed the patient at the 89th percentile for age and sex matched control, mild to 25 to 49% stenosis in proximal LAD, mild to 25 to 49% disease the mid LAD, proximal D3 and mid RCA.  Patient was last seen by Dr. Tresa Endo in March 2021, low dose lovastatin was discontinued and she was started on a trial of low-dose Crestor again along with Zetia.  Unfortunately she had severe cramps and pain in her lower extremity.  She was seen by our clinical pharmacist in April 2021 who discussed with her possibility of PCSK9 inhibitor including Repatha and Praluent.  She was started on Praluent 150 mg every 2 weeks.  Last lipid panel obtained in May 2021 at [facility] system showed very well-controlled cholesterol.  Patient presents today for cardiology office visit.  She denies any recent chest pain or shortness of breath.  She is quite happy that her last cholesterol lab work at [facility] shows very well controlled LDL.  She does have a rash for the first 4 days after Praluent injection every  single time.  I will give her cortisone 1% cream to help with her localized inflammation.  Otherwise she can follow-up in 6 months.  The next office visit she has with her PCP is in November at which time a repeat lipid panel can be obtained.   Past Medical History:  Diagnosis Date  . ADD (attention deficit disorder with hyperactivity)   . Anxiety   . Arthritis   . Complication of anesthesia    nausea  . Diverticulitis 04/2011  . Frequency   . GERD (gastroesophageal reflux disease)   . Hyperlipidemia   . Hypertension    no longer treated bp wnl x 1 year  . Retinal vein occlusion    has appointment w Dr. Ashley Royalty 2/24    Past Surgical History:  Procedure Laterality Date  . ABDOMINAL HYSTERECTOMY    . APPENDECTOMY    . BACK SURGERY    . 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 CARDIAC CATHETERIZATION    . CHOLECYSTECTOMY    . KNEE ARTHROSCOPY  01/07/2012   Procedure: ARTHROSCOPY KNEE;  Surgeon: [name], MD;  Location: [facility];  Service: Orthopedics;  Laterality: Right;  medial menical debridement, chondroplasty  . KNEE ARTHROSCOPY Right 09/08/2012   Procedure: ARTHROSCOPY KNEE;  Surgeon: [name], MD;  Location: [facility];  Service: Orthopedics;  Laterality: Right;  WITH DEBRIDEMENT   . TOTAL KNEE ARTHROPLASTY Right 07/11/2013  Procedure: RIGHT TOTAL KNEE ARTHROPLASTY;  Surgeon: [name], MD;  Location: [facility];  Service: Orthopedics;  Laterality: Right;    Current Medications: Current Meds  Medication Sig  . Alirocumab (PRALUENT) 150 MG/ML SOAJ Inject 150 mg into the skin every 14 (fourteen) days.  Marland Kitchen losartan (COZAAR) 100 MG tablet Take 100 mg by mouth daily.  . meloxicam (MOBIC) 7.5 MG tablet Take 1 tablet (7.5 mg total) by mouth daily.     Allergies:   Meperidine, Red dye, Codeine, Darvocet [propoxyphene n-acetaminophen], Morphine and related, Percodan [oxycodone-aspirin], and Statins    Social History   Socioeconomic History  . Marital status: Married    Spouse name: Not on file  . Number of children: Not on file  . Years of education: Not on file  . Highest education level: Not on file  Occupational History  . Not on file  Tobacco Use  . Smoking status: Former Smoker    Quit date: 09/03/1998    Years since quitting: 21.5  . Smokeless tobacco: Former Neurosurgeon    Quit date: 01/01/1999  Substance and Sexual Activity  . Alcohol use: No  . Drug use: No  . Sexual activity: Not on file  Other Topics Concern  . Not on file  Social History Narrative  . Not on file   Social Determinants of Health   Financial Resource Strain:   . Difficulty of Paying Living Expenses: Not on file  Food Insecurity:   . Worried About Programme researcher, broadcasting/film/video in the Last Year: Not on file  . Ran Out of Food in the Last Year: Not on file  Transportation Needs:   . Lack of Transportation (Medical): Not on file  . Lack of Transportation (Non-Medical): Not on file  Physical Activity:   . Days of Exercise per Week: Not on file  . Minutes of Exercise per Session: Not on file  Stress:   . Feeling of Stress : Not on file  Social Connections:   . Frequency of Communication with Friends and Family: Not on file  . Frequency of Social Gatherings with Friends and Family: Not on file  . Attends Religious Services: Not on file  . Active Member of Clubs or Organizations: Not on file  . Attends Banker Meetings: Not on file  . Marital Status: Not on file     Family History: The patient's family history includes CAD in her father; Cerebrovascular Disease in her cousin; Heart attack in her cousin; Heart disease in her mother; Peripheral vascular disease in her father.  ROS:   Please see the history of present illness.     All other systems reviewed and are negative.  EKGs/Labs/Other Studies Reviewed:    The following studies were reviewed today:  Coronary CT  08/02/2019 IMPRESSION: 1. Coronary calcium score of 248. This was 65 percentile for age and sex matched control.  2. Normal coronary origin with right dominance.  3. Mild (25-49%) mixed plaque stenosis in the proximal LAD; mild (25-49%) calcified stenosis in the mid LAD, proximal D3 and mid RCA; CADRADS-2.  EKG:  EKG is not ordered today.    Recent Labs: 07/26/2019: ALT 21; BUN 21; Creatinine, Ser 0.76; Potassium 5.0; Sodium 140  Recent Lipid Panel    Component Value Date/Time   CHOL 191 07/26/2019 0843   TRIG 177 (H) 07/26/2019 0843   HDL 47 07/26/2019 0843   CHOLHDL 4.1 07/26/2019 0843   CHOLHDL 3.6 06/28/2013 0839   VLDL 45 (H)  06/28/2013 0839   LDLCALC 113 (H) 07/26/2019 0843    Physical Exam:    VS:  BP 140/80   Pulse 70   Ht 5\' 4"  (1.626 m)   Wt 203 lb 6.4 oz (92.3 kg)   BMI 34.91 kg/m     Wt Readings from Last 3 Encounters:  03/07/20 203 lb 6.4 oz (92.3 kg)  10/24/19 211 lb 6.4 oz (95.9 kg)  09/15/19 208 lb 6.4 oz (94.5 kg)     GEN:  Well nourished, well developed in no acute distress HEENT: Normal NECK: No JVD; No carotid bruits LYMPHATICS: No lymphadenopathy CARDIAC: RRR, no murmurs, rubs, gallops RESPIRATORY:  Clear to auscultation without rales, wheezing or rhonchi  ABDOMEN: Soft, non-tender, non-distended MUSCULOSKELETAL:  No edema; No deformity  SKIN: Warm and dry NEUROLOGIC:  Alert and oriented x 3 PSYCHIATRIC:  Normal affect   ASSESSMENT:    1. Coronary artery disease involving native coronary artery of native heart without angina pectoris   2. Essential hypertension   3. Hyperlipidemia LDL goal <70    PLAN:    In order of problems listed above:  1. CAD: Mild disease noted on coronary CT obtained in January 2021.  Patient denies any obvious anginal symptoms  2. Hypertension: Blood pressure stable on current therapy  3. Hyperlipidemia: Started on Praluent with quite excellent control of the cholesterol.  Unfortunately she does develop  a rash at the injection site within the first 4 days after the injection.  However she does not wish to switch off of Praluent because it works so well.  I have given her a course of hydrocortisone cream to help with the localized allergic reaction.   Medication Adjustments/Labs and Tests Ordered: Current medicines are reviewed at length with the patient today.  Concerns regarding medicines are outlined above.  No orders of the defined types were placed in this encounter.  Meds ordered this encounter  Medications  . hydrocortisone 1 % lotion    Sig: Apply 1 application topically 2 (two) times daily.    Dispense:  118 mL    Refill:  0    Patient Instructions  Medication Instructions:  Hydrocortisone Lotion- apply to rash after using injection as needed *If you need a refill on your cardiac medications before your next appointment, please call your pharmacy*   Lab Work: None Ordered At This Time.    Testing/Procedures: None Ordered At This Time. '   Follow-Up: [facility], you and your health needs are our priority.  As part of our continuing mission to provide you with exceptional heart care, we have created designated Provider Care Teams.  These Care Teams include your primary Cardiologist (physician) and Advanced Practice Providers (APPs -  Physician Assistants and Nurse Practitioners) who all work together to provide you with the care you need, when you need it.  We recommend signing up for the patient portal called "MyChart".  Sign up information is provided on this After Visit Summary.  MyChart is used to connect with patients for Virtual Visits (Telemedicine).  Patients are able to view lab/test results, encounter notes, upcoming appointments, etc.  Non-urgent messages can be sent to your provider as well.   To learn more about what you can do with MyChart, go to [email or web address].    Your next appointment:   6 month(s)  The format for your next appointment:    In Person  Provider:   Nicki Guadalajarahomas Kelly, MD     Signed, Azalee CourseHao Tavious Griesinger, PA  03/10/2020 12:01 AM    [facility]

## 2020-03-07 NOTE — Patient Instructions (Addendum)
Medication Instructions:  Hydrocortisone Lotion- apply to rash after using injection as needed *If you need a refill on your cardiac medications before your next appointment, please call your pharmacy*   Lab Work: None Ordered At This Time.    Testing/Procedures: None Ordered At This Time. '   Follow-Up: At [facility], you and your health needs are our priority.  As part of our continuing mission to provide you with exceptional heart care, we have created designated Provider Care Teams.  These Care Teams include your primary Cardiologist (physician) and Advanced Practice Providers (APPs -  Physician Assistants and Nurse Practitioners) who all work together to provide you with the care you need, when you need it.  We recommend signing up for the patient portal called "MyChart".  Sign up information is provided on this After Visit Summary.  MyChart is used to connect with patients for Virtual Visits (Telemedicine).  Patients are able to view lab/test results, encounter notes, upcoming appointments, etc.  Non-urgent messages can be sent to your provider as well.   To learn more about what you can do with MyChart, go to [email or web address].    Your next appointment:   6 month(s)  The format for your next appointment:   In Person  Provider:   Nicki Guadalajara, MD

## 2020-03-09 ENCOUNTER — Encounter: Payer: Self-pay | Admitting: Physician Assistant

## 2020-03-20 ENCOUNTER — Other Ambulatory Visit: Payer: Self-pay | Admitting: Pharmacist Clinician (PhC)/ Clinical Pharmacy Specialist

## 2020-03-20 MED ORDER — REPATHA SURECLICK 140 MG/ML ~~LOC~~ SOAJ: 140.0000 mg | SUBCUTANEOUS | 0 refills | Status: DC

## 2020-03-20 NOTE — Telephone Encounter (Signed)
Patient developed rash at injection site after Praluent injections.  Hesitant to stop due to benefits in lowering LDL.  Will have her try sample of Repatha 140 mg to see if irritation continues  Patient agreeable and will let us know how she tolerates this.

## 2020-05-10 ENCOUNTER — Other Ambulatory Visit: Payer: Self-pay | Admitting: Cardiovascular Disease

## 2020-05-16 ENCOUNTER — Other Ambulatory Visit: Payer: Self-pay | Admitting: Physician Assistant

## 2020-05-16 NOTE — Telephone Encounter (Signed)
Bianca Ferguson, sounds like Mrs. Rydberg is doing well so far on Nexlizet, however when I tried to prescribe it, it says it is not reimbursable. Is there anything I need to worry about her insurance coverage for Nexlizet

## 2020-05-17 ENCOUNTER — Other Ambulatory Visit: Payer: Self-pay

## 2020-05-17 MED ORDER — NEXLIZET 180-10 MG PO TABS: 180.0000 mg | ORAL_TABLET | Freq: Every day | ORAL | 11 refills | Status: DC

## 2020-05-18 ENCOUNTER — Telehealth: Payer: Self-pay

## 2020-05-18 NOTE — Telephone Encounter (Signed)
Prior authorization for Nexlizet has been approved through 07/13/21.  

## 2020-06-13 ENCOUNTER — Telehealth: Payer: Self-pay

## 2020-06-13 MED ORDER — FENOFIBRATE 48 MG PO TABS: 48.0000 mg | ORAL_TABLET | Freq: Every day | ORAL | 1 refills | Status: DC

## 2020-06-13 NOTE — Telephone Encounter (Signed)
Patient unable to come in 2 months d/t incoming foot surgery, but plan to come back as soon as possible.   Will continue nexlizet, continue to manage diet as possible, and add fenofibrate 48mg  daily.

## 2020-06-13 NOTE — Telephone Encounter (Signed)
Called the pt to see if they are taking repatha and she stated that she isn't so I removed it from med list and also can't take praluent. Pt sent in a mychart msg with lipid labs while on nexlizet and they are still elevated and they would like to know what to do from here. Will route to pharmd pool

## 2020-06-13 NOTE — Telephone Encounter (Signed)
LDL history  Sept/2019  = 660 Jan/2021 = 113 rosuvastatin plus ezetimibe (unable to tolerate rosuvastatin) May/2021 = 68 on Praluent - unable to tolerate d/t rash (ADR with repatha as well) Nov/2021 = 98 on Nexlizet 180/10  Will recommend to stay on Nexlizet for now (if able to tolerate). Noted 35% drop in LDL.  Continue to work on low fat diet and more plant bases food options. Please keep simple sugar intake (white rice, pasta, white bread, cake, candy, sodas, and potatoes) low as well to help with triglycerides control.  She is due to follow up with DR Tresa Endo in February.Please schedule appointment as soon as possible.

## 2020-07-11 ENCOUNTER — Other Ambulatory Visit: Payer: Self-pay | Admitting: Cardiovascular Disease

## 2020-08-17 ENCOUNTER — Other Ambulatory Visit: Payer: Self-pay | Admitting: Cardiovascular Disease

## 2020-09-09 ENCOUNTER — Other Ambulatory Visit: Payer: Self-pay | Admitting: Cardiovascular Disease

## 2020-11-04 ENCOUNTER — Other Ambulatory Visit: Payer: Self-pay | Admitting: Cardiovascular Disease

## 2021-02-21 DIAGNOSIS — E875 Hyperkalemia: Secondary | ICD-10-CM

## 2021-02-26 LAB — BASIC METABOLIC PANEL
BUN/Creatinine Ratio: 25 (ref 12–28)
BUN: 23 mg/dL (ref 8–27)
CO2: 22 mmol/L (ref 20–29)
Calcium: 10.2 mg/dL (ref 8.7–10.3)
Chloride: 101 mmol/L (ref 96–106)
Creatinine, Ser: 0.93 mg/dL (ref 0.57–1.00)
Glucose: 103 mg/dL — ABNORMAL HIGH (ref 65–99)
Potassium: 5.7 mmol/L — ABNORMAL HIGH (ref 3.5–5.2)
Sodium: 144 mmol/L (ref 134–144)
eGFR: 67 mL/min/{1.73_m2} (ref >59)

## 2021-02-26 MED ORDER — AMLODIPINE BESYLATE 5 MG PO TABS: 5.0000 mg | ORAL_TABLET | Freq: Every day | ORAL | 3 refills | Status: DC

## 2021-02-26 NOTE — Telephone Encounter (Signed)
K+5.7, recommend stopping losartan and repeat BMET in 1 week.  Will likely need alternative BP med, would start amlodipine 5mg  daily and ask to check BP daily over next 2 weeks and call with results.

## 2021-02-26 NOTE — Addendum Note (Signed)
Addended by: Lindell Spar on: 02/26/2021 01:43 PM   Modules accepted: Orders

## 2021-02-28 ENCOUNTER — Other Ambulatory Visit: Payer: Self-pay | Admitting: Cardiovascular Disease

## 2021-03-08 LAB — BASIC METABOLIC PANEL
BUN/Creatinine Ratio: 21 (ref 12–28)
BUN: 20 mg/dL (ref 8–27)
CO2: 20 mmol/L (ref 20–29)
Calcium: 9.7 mg/dL (ref 8.7–10.3)
Chloride: 101 mmol/L (ref 96–106)
Creatinine, Ser: 0.94 mg/dL (ref 0.57–1.00)
Glucose: 93 mg/dL (ref 65–99)
Potassium: 4.8 mmol/L (ref 3.5–5.2)
Sodium: 140 mmol/L (ref 134–144)
eGFR: 67 mL/min/{1.73_m2} (ref >59)

## 2021-04-19 MED ORDER — AMLODIPINE BESYLATE 10 MG PO TABS: 10.0000 mg | ORAL_TABLET | Freq: Every day | ORAL | 3 refills | Status: DC

## 2021-05-18 ENCOUNTER — Other Ambulatory Visit: Payer: Self-pay | Admitting: Cardiovascular Disease

## 2021-05-28 ENCOUNTER — Other Ambulatory Visit: Payer: Self-pay | Admitting: Cardiovascular Disease

## 2021-06-21 ENCOUNTER — Other Ambulatory Visit: Payer: Self-pay | Admitting: Cardiovascular Disease

## 2021-06-21 MED ORDER — NEXLIZET 180-10 MG PO TABS: 1.0000 | ORAL_TABLET | Freq: Every day | ORAL | 3 refills | Status: AC

## 2021-06-27 ENCOUNTER — Encounter: Payer: Self-pay | Admitting: Cardiovascular Disease

## 2021-06-27 ENCOUNTER — Other Ambulatory Visit: Payer: Self-pay | Admitting: Cardiovascular Disease

## 2021-08-07 ENCOUNTER — Other Ambulatory Visit: Payer: Self-pay | Admitting: Cardiovascular Disease

## 2021-08-19 ENCOUNTER — Encounter: Payer: Self-pay | Admitting: Cardiovascular Disease

## 2021-08-19 NOTE — Telephone Encounter (Signed)
Sent this message to Mychart.  If you go to this website, there is information on how to save on Nexlizet.    [email or web address]    You may want to check with your pharmacist as well.

## 2021-08-20 ENCOUNTER — Telehealth: Payer: Self-pay

## 2021-08-20 NOTE — Telephone Encounter (Signed)
Received information from [employer] that patient was approved for Nexlizet from 07/14/2021-07/13/2022.   Will notify our pharmacy team who assisted in this. Will scan approval into chart.   Thanks!

## 2021-10-15 ENCOUNTER — Ambulatory Visit: Payer: Medicare HMO | Admitting: Cardiovascular Disease

## 2022-04-11 ENCOUNTER — Other Ambulatory Visit: Payer: Self-pay | Admitting: Cardiovascular Disease

## 2022-04-23 ENCOUNTER — Ambulatory Visit: Payer: Medicare HMO | Attending: Cardiovascular Disease | Admitting: Cardiovascular Disease

## 2022-07-09 ENCOUNTER — Other Ambulatory Visit: Payer: Self-pay | Admitting: Cardiovascular Disease

## 2022-07-11 ENCOUNTER — Other Ambulatory Visit: Payer: Self-pay | Admitting: Cardiovascular Disease
